# Patient Record
Sex: Male | Born: 1965 | Race: White | Hispanic: No | Marital: Married | Smoking: Current every day smoker
Health system: Southern US, Community
[De-identification: ages and names within clinical notes are randomized; demographics above are authoritative.]

## PROBLEM LIST (undated history)

## (undated) DIAGNOSIS — Z9889 Other specified postprocedural states: Secondary | ICD-10-CM

## (undated) DIAGNOSIS — R112 Nausea with vomiting, unspecified: Secondary | ICD-10-CM

## (undated) DIAGNOSIS — T428X1A Poisoning by antiparkinsonism drugs and other central muscle-tone depressants, accidental (unintentional), initial encounter: Secondary | ICD-10-CM

## (undated) DIAGNOSIS — F419 Anxiety disorder, unspecified: Secondary | ICD-10-CM

## (undated) DIAGNOSIS — M1990 Unspecified osteoarthritis, unspecified site: Secondary | ICD-10-CM

## (undated) DIAGNOSIS — Z87442 Personal history of urinary calculi: Secondary | ICD-10-CM

## (undated) HISTORY — PX: FEMUR SURGERY: SHX943

## (undated) HISTORY — PX: VASECTOMY: SHX75

---

## 1995-04-29 HISTORY — PX: FOOT SURGERY: SHX648

## 1998-04-28 HISTORY — PX: HERNIA REPAIR: SHX51

## 1999-03-08 ENCOUNTER — Encounter: Payer: Self-pay | Admitting: Family Medicine

## 1999-03-08 ENCOUNTER — Inpatient Hospital Stay (HOSPITAL_COMMUNITY): Admission: EM | Admit: 1999-03-08 | Discharge: 1999-03-10 | Payer: Self-pay | Admitting: Family Medicine

## 1999-03-09 ENCOUNTER — Encounter: Payer: Self-pay | Admitting: Internal Medicine

## 1999-12-13 ENCOUNTER — Inpatient Hospital Stay (HOSPITAL_COMMUNITY): Admission: EM | Admit: 1999-12-13 | Discharge: 1999-12-15 | Payer: Self-pay | Admitting: *Deleted

## 1999-12-13 ENCOUNTER — Encounter (INDEPENDENT_AMBULATORY_CARE_PROVIDER_SITE_OTHER): Payer: Self-pay | Admitting: Specialist

## 2000-01-06 ENCOUNTER — Encounter: Admission: RE | Admit: 2000-01-06 | Discharge: 2000-01-06 | Payer: Self-pay | Admitting: General Surgery

## 2000-01-06 ENCOUNTER — Encounter: Payer: Self-pay | Admitting: General Surgery

## 2000-04-28 HISTORY — PX: APPENDECTOMY: SHX54

## 2000-08-04 ENCOUNTER — Ambulatory Visit (HOSPITAL_BASED_OUTPATIENT_CLINIC_OR_DEPARTMENT_OTHER): Admission: RE | Admit: 2000-08-04 | Discharge: 2000-08-04 | Payer: Self-pay | Admitting: General Surgery

## 2003-01-24 ENCOUNTER — Encounter: Admission: RE | Admit: 2003-01-24 | Discharge: 2003-01-24 | Payer: Self-pay | Admitting: Diagnostic Radiology

## 2003-05-22 ENCOUNTER — Encounter: Admission: RE | Admit: 2003-05-22 | Discharge: 2003-05-22 | Payer: Self-pay | Admitting: Family Medicine

## 2003-06-22 ENCOUNTER — Encounter: Admission: RE | Admit: 2003-06-22 | Discharge: 2003-06-22 | Payer: Self-pay | Admitting: Family Medicine

## 2003-07-03 ENCOUNTER — Encounter: Admission: RE | Admit: 2003-07-03 | Discharge: 2003-07-25 | Payer: Self-pay | Admitting: Family Medicine

## 2003-07-22 ENCOUNTER — Inpatient Hospital Stay (HOSPITAL_COMMUNITY): Admission: EM | Admit: 2003-07-22 | Discharge: 2003-07-23 | Payer: Self-pay | Admitting: Emergency Medicine

## 2003-11-16 ENCOUNTER — Emergency Department (HOSPITAL_COMMUNITY): Admission: EM | Admit: 2003-11-16 | Discharge: 2003-11-16 | Payer: Self-pay | Admitting: Emergency Medicine

## 2004-05-17 ENCOUNTER — Encounter: Admission: RE | Admit: 2004-05-17 | Discharge: 2004-05-17 | Payer: Self-pay | Admitting: Family Medicine

## 2006-10-13 ENCOUNTER — Encounter: Admission: RE | Admit: 2006-10-13 | Discharge: 2006-10-13 | Payer: Self-pay | Admitting: Family Medicine

## 2008-03-06 ENCOUNTER — Encounter: Admission: RE | Admit: 2008-03-06 | Discharge: 2008-03-06 | Payer: Self-pay | Admitting: Family Medicine

## 2010-05-19 ENCOUNTER — Encounter: Payer: Self-pay | Admitting: Family Medicine

## 2010-09-13 NOTE — Op Note (Signed)
[facility]  Patient:    Justin Raymond, Justin Raymond                        MRN: [id number] Proc. Date: 12/13/99 Adm. Date:  [id number] Disc. Date: [id number] Attending:  Caleen Essex                           Operative Report  PREOPERATIVE DIAGNOSIS:  Appendicitis.  POSTOPERATIVE DIAGNOSIS:  Appendicitis.  OPERATION PERFORMED:  Appendectomy.  SURGEON:  Chevis Pretty, M.D.  ANESTHESIA:  General endotracheal.  DESCRIPTION OF PROCEDURE:  After informed consent was obtained, the patient was brought to the operating room and placed in the supine position on the operating table.  After adequate induction of general anesthesia, the patients abdomen was prepped with Betadine and draped in the usual sterile manner.  A transverse incision was made in his right lower quadrant approximately halfway between his anterosuperior iliac spine and umbilicus. This incision was carried down through the skin and subcutaneous tissue using the Bovie electrocautery until the fascia of the external oblique was encountered.  This fascial layer was opened with the Bovie electrocautery and the muscles were spread bluntly.  Next, the fascia of the internal oblique and transversalis layers were encountered.  These were both opened with the Bovie electrocautery until the peritoneum was encountered.  The peritoneum was grasped between two tonsil clamps and palpated to make sure there were no viscera between these clamps. The peritoneum was then opened with the Metzenbaum scissors and then under direct vision, the rest of the peritoneum was opened with the Bovie electrocautery.  The abdomen was palpated with an index finger and the appendix was encountered.  It was mobilized up into the wound and grasped with a Babcock clamp.  The mesentery of the appendix was taken down by serially clamping, cutting and tying with 2-0 silk ties until the base of the appendix where it joins with the cecum was  cleared.  A 2-0 silk pursestring suture was placed around the base of the appendix.  The appendix was then clamped with a straight clamp at its base.  The clamp was then removed and placed just several mm distal to this.  The previously clamped portion of the base of the appendix was then tied with a 0 chromic.  The appendix was then excised with a 10 blade knife and sent for pathology.  The appendiceal stump was grasped with a hemostat and dunked into the cecum.  The pursestring suture was then cinched down and tied.  A Z stitch was then placed over top of this with another 2-0 silk suture.  The abdomen was irrigated with copious amounts of saline.  The peritoneum, transversalis and internal oblique layer was closed with a 0 PDS in a running fashion.  The wound was then irrigated with saline again and the external oblique layer was also closed with a 0 PDS suture in a running fashion.  The wound again was irrigated and Scarpas fascia was reapproximated with several 2-0 Vicryl interrupted sutures.  The skin was closed with a running 4-0 Monocryl subcuticular stitch.  A sterile dressing and Steri-Strips were applied.  The patient tolerated the procedure well.  At the end of the case, all needle, sponge and instrument counts were correct.  The patient was awakened and taken to the recovery room in stable condition. DD:  12/13/99 TD:  12/16/99 Job: [id number]

## 2010-09-13 NOTE — Discharge Summary (Signed)
[facility]  Patient:    Justin Raymond, Justin Raymond                        MRN: [id number] Adm. Date:  [id number] Disc. Date: [id number] Attending:  Caleen Essex                           Discharge Summary  For details of Mr. Makarewicz History and Physical, please see H&P. In summary, he is a 45 year old white male who presented with acute onset of right lower quadrant pain, elevated white count, and localized guarding on physical exam.  He was felt to have appendicitis and was taken to the operating room for an appendectomy.  He tolerated the procedure well but postoperatively had some significant pain problems.  His pain is now under control on p.o. pain medicines.  He is tolerating the diet.  He is ambulating without difficulty, was able to urinate without difficulty, and he is ready for discharge.  DISCHARGE MEDICATIONS: 1. His regular home medications. 2. Vicodin 1 to 2 p.o. q.4-6h.  ACTIVITY: He is instructed to do no heavy lifting for the next several weeks.  DIET:  Regular.  CONDITION:  Stable.  FOLLOWUP:  We will have him follow up in approximately two weeks in the clinic. DD:  12/15/99 TD:  12/16/99 Job: [id number]

## 2010-09-13 NOTE — Op Note (Signed)
[facility]  Patient:    Justin Raymond, Justin Raymond                        MRN: [id number] Proc. Date: 08/04/00 Adm. Date:  [id number] Attending:  Janalyn Rouse CC:         Dyanne Carrel, M.D.                           Operative Report  PREOPERATIVE DIAGNOSIS:  Right inguinal hernia.  POSTOPERATIVE DIAGNOSIS:  Right direct inguinal hernia.  OPERATION PERFORMED:  Right inguinal hernia repair with Marlex mesh.  SURGEON:  Rose Phi. Maple Hudson, M.D.  ANESTHESIA:  General.  DESCRIPTION OF PROCEDURE:  After suitable general anesthesia was induced, the patient was placed in the supine position and the right lower abdomen prepped and draped in the usual fashion.  A local infiltration using 0.25% Marcaine was used as we progressed along the procedure.  A right inguinal incision was made with dissection down to the fascia of the external oblique.  Hemostasis obtained with the cautery.  We then infiltrated under the external oblique fascia and incised it down to the external ring.  The ilioinguinal nerve was protected.  Spermatic cord was mobilized and a drain placed about it and careful dissection revealed no indirect component.  He had two small defects in the floor of the canal where preperitoneal fat was protruding through and which had caused his swelling and his pain. Both of these were reduced and the defects closed with 2-0 Vicryl.  We then took appropriately trimmed Marlex mesh and placed it on the floor of the canal in a tension free fashion with a lateral margin incised for the cord.  Interrupted 2-0 Prolene sutures were then used to suture the lower margin to the shelving portion of Pouparts ligament and the upper margin to the muscle.  The tail of the mesh was sutured lateral to the cord.  The external oblique fascia was then closed with a running 2-0 Vicryl.  3-0 Vicryl was used for the subcutaneous tissues.  Interrupted subcuticular  4-0 Monocryls were used for the skin along with Steri-Strips.  Dressing applied. The patient was transferred to the recovery room in satisfactory condition having tolerated the procedure well. DD:  08/04/00 TD:  08/04/00 Job: [id number]

## 2013-11-02 ENCOUNTER — Encounter (HOSPITAL_COMMUNITY): Payer: Self-pay | Admitting: Emergency Medicine

## 2013-11-02 ENCOUNTER — Emergency Department (HOSPITAL_COMMUNITY)
Admission: EM | Admit: 2013-11-02 | Discharge: 2013-11-02 | Disposition: A | Discharge status: Home / Self Care | Payer: No Typology Code available for payment source | Attending: Emergency Medicine | Admitting: Emergency Medicine

## 2013-11-02 ENCOUNTER — Emergency Department (HOSPITAL_COMMUNITY): Payer: No Typology Code available for payment source

## 2013-11-02 DIAGNOSIS — IMO0002 Reserved for concepts with insufficient information to code with codable children: Secondary | ICD-10-CM | POA: Insufficient documentation

## 2013-11-02 DIAGNOSIS — F17200 Nicotine dependence, unspecified, uncomplicated: Secondary | ICD-10-CM | POA: Insufficient documentation

## 2013-11-02 DIAGNOSIS — M5413 Radiculopathy, cervicothoracic region: Secondary | ICD-10-CM

## 2013-11-02 DIAGNOSIS — M5412 Radiculopathy, cervical region: Secondary | ICD-10-CM | POA: Insufficient documentation

## 2013-11-02 MED ORDER — KETOROLAC TROMETHAMINE 60 MG/2ML IM SOLN
60.0000 mg | Freq: Once | INTRAMUSCULAR | Status: AC
  Administered 2013-11-02: 60 mg via INTRAMUSCULAR
  Filled 2013-11-02: qty 2

## 2013-11-02 MED ORDER — DEXAMETHASONE SODIUM PHOSPHATE 10 MG/ML IJ SOLN
10.0000 mg | Freq: Once | INTRAMUSCULAR | Status: AC
  Administered 2013-11-02: 10 mg via INTRAMUSCULAR
  Filled 2013-11-02: qty 1

## 2013-11-02 MED ORDER — PREDNISONE 50 MG PO TABS: 50.0000 mg | ORAL_TABLET | Freq: Every day | ORAL | Status: DC

## 2013-11-02 MED ORDER — HYDROCODONE-ACETAMINOPHEN 5-325 MG PO TABS: 1.0000 | ORAL_TABLET | Freq: Four times a day (QID) | ORAL | Status: DC | PRN

## 2013-11-02 NOTE — ED Notes (Signed)
Pt c/o increasing L arm numbness and tingling starting this morning and L shoulder pain radiating into L chest and down L arm starting this afternoon.  Pain score 2/10.  Pt reports "it feels like my fingers are asleep."  Pt reports that numbness and pain get worse in certain positions.

## 2013-11-02 NOTE — Discharge Instructions (Signed)
Followup with your Dr. and the neurosurgeon provided.  Return here as needed.  Use ice and heat on your neck and upper back

## 2013-11-02 NOTE — ED Provider Notes (Signed)
Medical screening examination/treatment/procedure(s) were performed by non-physician practitioner and as supervising physician I was immediately available for consultation/collaboration.   EKG Interpretation None       Juliet RudeNathan R. Rubin PayorPickering, MD 11/02/13 [phone]

## 2013-11-02 NOTE — ED Provider Notes (Signed)
CSN: [id number]     Arrival date & time 11/02/13  1853 History   First MD Initiated Contact with Patient 11/02/13 1858     Chief Complaint  Patient presents with  . Numbness  . Back Pain     (Consider location/radiation/quality/duration/timing/severity/associated sxs/prior Treatment) Patient is a 48 y.o. male presenting with back pain.  Back Pain   Mr. Justin Raymond is a 48 year old presents to the ED complaining of left arm numbness and tingling. He states that pain and tingling started this morning with a sudden onset and has gradually gotten worse. He notes the pain starts in his shoulder blade area and radiates down his left arm and towards his left pectoral muscle. He notes movement makes it feel better and not moving makes it worse. 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 He states the numbness and tingling is still present. He rates it a 2 out of 10.  Endorses: numbness/tingling in left arm. Denies: headache, fatigue, change in appetite, sore throat, shortness of breath, cough, chest pain, abdominal pain, nausea, vomiting, diarrhea.   History reviewed. No pertinent past medical history. Past Surgical History  Procedure Laterality Date  . Femur surgery    . Hernia repair     History reviewed. No pertinent family history. History  Substance Use Topics  . Smoking status: Current Every Day Smoker -- 2.00 packs/day    Types: Cigarettes  . Smokeless tobacco: Not on file  . Alcohol Use: Yes     Comment: occ    Review of Systems  Musculoskeletal: Positive for back pain.   All other systems negative except as documented in the HPI. All pertinent positives and negatives as reviewed in the HPI.    Allergies  Review of patient's allergies indicates not on file.  Home Medications   Prior to Admission medications   Not on File   BP 152/91  Temp(Src) 98.7 F (37.1 C) (Oral)  Resp 18   SpO2 98% Physical Exam  Nursing note and vitals reviewed. Constitutional: He is oriented to person, place, and time. He appears well-developed and well-nourished.  HENT:  Head: Normocephalic.  Eyes: Pupils are equal, round, and reactive to light.  Cardiovascular: Normal rate, regular rhythm and normal heart sounds.   No murmur heard. Pulmonary/Chest: Effort normal and breath sounds normal. No respiratory distress. He has no rales.  Abdominal: Soft.  Musculoskeletal: Normal range of motion.  Neurological: He is alert and oriented to person, place, and time. He has normal strength. He displays normal reflexes. No cranial nerve deficit or sensory deficit. He exhibits normal muscle tone. Coordination and gait normal. GCS eye subscore is 4. GCS verbal subscore is 5. GCS motor subscore is 6.  Reflex Scores:      Tricep reflexes are 2+ on the right side and 2+ on the left side.      Bicep reflexes are 2+ on the right side and 2+ on the left side.      Brachioradialis reflexes are 2+ on the right side and 2+ on the left side. Skin: Skin is warm and dry.    ED Course  Procedures (including critical care time)  Patient be treated for cervical radiculopathy.  Told to follow up with his primary care Dr. and followup with neurosurgery patient is advised to use ice and heat on his neck and back.  Patient has no neurological deficits noted on exam.  Carlyle Dollyhristopher W Lyrika Souders, PA-C 11/02/13 2030

## 2013-11-02 NOTE — Progress Notes (Signed)
  CARE MANAGEMENT ED NOTE 11/02/2013  Patient:  Justin Raymond,Justin Raymond   Account Number:  [id number]  Date Initiated:  11/02/2013  Documentation initiated by:  Radford PaxFERRERO,Raylyn Speckman  Subjective/Objective Assessment:   Patient presents to Ed with left arm numbness and tingling     Subjective/Objective Assessment Detail:     Action/Plan:   Action/Plan Detail:   Anticipated DC Date:       Status Recommendation to Physician:   Result of Recommendation:    Other ED Services  Consult Working Plan    DC Planning Services  Other  PCP issues    Choice offered to / List presented to:            Status of service:  Completed, signed off  ED Comments:   ED Comments Detail:  EDCM spoke to patient at bedside.  Patient confirms his pcp is Dr[facility] Man[facility].  System updated.

## 2013-11-05 ENCOUNTER — Encounter (HOSPITAL_COMMUNITY): Payer: Self-pay | Admitting: Emergency Medicine

## 2013-11-05 ENCOUNTER — Emergency Department (HOSPITAL_COMMUNITY)
Admission: EM | Admit: 2013-11-05 | Discharge: 2013-11-05 | Disposition: A | Discharge status: Home / Self Care | Payer: No Typology Code available for payment source | Attending: Emergency Medicine | Admitting: Emergency Medicine

## 2013-11-05 ENCOUNTER — Emergency Department (HOSPITAL_COMMUNITY): Payer: No Typology Code available for payment source

## 2013-11-05 DIAGNOSIS — R000 Tachycardia, unspecified: Secondary | ICD-10-CM | POA: Insufficient documentation

## 2013-11-05 DIAGNOSIS — Z79899 Other long term (current) drug therapy: Secondary | ICD-10-CM | POA: Insufficient documentation

## 2013-11-05 DIAGNOSIS — IMO0002 Reserved for concepts with insufficient information to code with codable children: Secondary | ICD-10-CM | POA: Insufficient documentation

## 2013-11-05 DIAGNOSIS — M542 Cervicalgia: Secondary | ICD-10-CM

## 2013-11-05 DIAGNOSIS — F17200 Nicotine dependence, unspecified, uncomplicated: Secondary | ICD-10-CM | POA: Insufficient documentation

## 2013-11-05 DIAGNOSIS — M5412 Radiculopathy, cervical region: Secondary | ICD-10-CM | POA: Insufficient documentation

## 2013-11-05 LAB — I-STAT TROPONIN, ED: TROPONIN I, POC: 0.01 ng/mL (ref 0.00–0.08)

## 2013-11-05 MED ORDER — DIAZEPAM 5 MG/ML IJ SOLN
5.0000 mg | Freq: Once | INTRAMUSCULAR | Status: AC
  Administered 2013-11-05: 5 mg via INTRAMUSCULAR
  Filled 2013-11-05: qty 2

## 2013-11-05 MED ORDER — HYDROMORPHONE HCL PF 1 MG/ML IJ SOLN
1.0000 mg | Freq: Once | INTRAMUSCULAR | Status: AC
  Administered 2013-11-05: 1 mg via INTRAMUSCULAR
  Filled 2013-11-05: qty 1

## 2013-11-05 MED ORDER — OXYCODONE-ACETAMINOPHEN 5-325 MG PO TABS: 1.0000 | ORAL_TABLET | Freq: Four times a day (QID) | ORAL | Status: DC | PRN

## 2013-11-05 MED ORDER — DIAZEPAM 5 MG PO TABS: 5.0000 mg | ORAL_TABLET | Freq: Four times a day (QID) | ORAL | Status: DC | PRN

## 2013-11-05 NOTE — ED Provider Notes (Signed)
CSN: [id number]     Arrival date & time 11/05/13  1503 History   First MD Initiated Contact with Patient 11/05/13 1618     Chief Complaint  Patient presents with  . Neck Pain   HPI Comments: Patient is a 48 y.o. Male who presents to the ED for neck pain which radiates down the left arm.  Patient states that he started to have neck pain when he woke up on Wednesday.  It has gradually worsened.  Patient describes pain as a 10/10 pain, which radiates down the left arm like lightening.  He feels like his muscles are going to break.  Patient was seen here in the ED on 11/02/13 and was given norco and prednisone which he has been taking at home with minor control of his symptoms.  Patient states that his last norco was a 9:00 am today.  He states that he has also had some chest pain as well today which is associated with some shortness of breath.  Patient is a smoker.     Patient is a 48 y.o. male presenting with neck pain. The history is provided by the patient. No language interpreter was used.  Neck Pain Associated symptoms: no fever     History reviewed. No pertinent past medical history. Past Surgical History  Procedure Laterality Date  . Femur surgery    . Hernia repair     No family history on file. History  Substance Use Topics  . Smoking status: Current Every Day Smoker -- 2.00 packs/day    Types: Cigarettes  . Smokeless tobacco: Not on file  . Alcohol Use: Yes     Comment: occ    Review of Systems  Constitutional: Negative for fever, chills and fatigue.  Gastrointestinal: Negative for nausea, vomiting, abdominal pain, diarrhea, constipation and blood in stool.  Genitourinary: Negative for dysuria, urgency, frequency, hematuria, decreased urine volume and difficulty urinating.  Musculoskeletal: Positive for myalgias, neck pain and neck stiffness. Negative for arthralgias, back pain, gait problem and joint swelling.  All other systems reviewed and are negative.     Allergies   Review of patient's allergies indicates no known allergies.  Home Medications   Prior to Admission medications   Medication Sig Start Date End Date Taking? Authorizing Provider  HYDROcodone-acetaminophen (NORCO/VICODIN) 5-325 MG per tablet Take 1 tablet by mouth every 6 (six) hours as needed for moderate pain. 11/02/13  Yes Jamesetta Orleans Lawyer, PA-C  predniSONE (DELTASONE) 50 MG tablet Take 1 tablet (50 mg total) by mouth daily. 11/02/13  Yes Christopher W Lawyer, PA-C   BP 142/98  Pulse 70  Temp(Src) 97.9 F (36.6 C) (Oral)  Resp 18  SpO2 98% Physical Exam  Nursing note and vitals reviewed. Constitutional: He is oriented to person, place, and time. He appears well-developed and well-nourished. No distress.  HENT:  Head: Normocephalic and atraumatic.  Mouth/Throat: Oropharynx is clear and moist. No oropharyngeal exudate.  Eyes: Conjunctivae and EOM are normal. Pupils are equal, round, and reactive to light. No scleral icterus.  Neck: No JVD present. No thyromegaly present.  Cardiovascular: Regular rhythm and intact distal pulses.  Tachycardia present.  Exam reveals no gallop and no friction rub.   No murmur heard. Pulmonary/Chest: Effort normal and breath sounds normal. No respiratory distress. He has no wheezes. He has no rales. He exhibits no tenderness.  Musculoskeletal:  Patient rises slowly from sitting to standing.  They walk without an antalgic gait.  There is no evidence of erythema, ecchymosis,  or gross deformity.  There is tenderness to palpation over inferior cervical spine.  There is palpable spasm of the left trapezius.  Active ROM is limited due to pain.  Sensation to light touch is intact over all extremities.  Strength is symmetric and equal in all extremities.  DTRs are equal and symmetric in all extremities.     Lymphadenopathy:    He has no cervical adenopathy.  Neurological: He is alert and oriented to person, place, and time. No cranial nerve deficit or sensory  deficit.  Skin: Skin is warm and dry. He is not diaphoretic.  Psychiatric: He has a normal mood and affect. His behavior is normal. Judgment and thought content normal.    ED Course  Procedures (including critical care time) Labs Review Labs Reviewed  Rosezena SensorI-STAT TROPOININ, ED    Imaging Review Dg Chest 2 View  11/05/2013   CLINICAL DATA:  Left-sided neck pain.  Left arm tingling.  EXAM: CHEST  2 VIEW  COMPARISON:  PA and lateral chest 11/16/2003.  FINDINGS: Lungs clear. Heart size normal. No pneumothorax or pleural effusion. Acromioclavicular degenerative change is noted.  IMPRESSION: No acute disease.   Electronically Signed   By: Drusilla Kannerhomas  Dalessio M.D.   On: 11/05/2013 18:20     EKG Interpretation None      MDM   Final diagnoses:  Neck pain  Left cervical radiculopathy   Patient is a 48 y.o. Male who presents to the ED with neck pain and chest pain.  Troponin, Ekg, and CXR here are unremarkable.  Suspect that this neck pain is likely caused by possible herniated disc which is causing cervical radiculopathy.  Patient denies IV drug use and cauda equina symptoms at this time.  Have treated the patient here with dilaudid and valium IM.  Will get the patient's pain under control at this time and send the patient home with a prescription for valium and oxycodone which should last him until tuesday.  Patient states his understanding at this time.  He was told to return for saddle anesthesias, loss of bowel or bladder, and pain which cannot be controlled at home.  I will have him follow-up with neurosurgery, and have told him to see his PCP on Monday or Tuesday.  He was given cauda equina symptoms as his return precautions.     Eben Burowourtney A Forcucci, PA-C 11/05/13 2006

## 2013-11-05 NOTE — ED Notes (Signed)
He tells us he was recently seen here for non-traumatic neck pain and was told he has a "pinched nerve".  He arrives very agitated, holding his left shoulder in an exaggerated flexion position and paces, as if in much pain.  He states the pain radiated into his chest.  His skin is normal, warm and dry.

## 2013-11-05 NOTE — ED Notes (Signed)
Bed: WA21 Expected date:  Expected time:  Means of arrival:  Comments: HOLD

## 2013-11-05 NOTE — Discharge Instructions (Signed)
Discontinue Norco and begin taking oxycodone for pain as needed every 6 hours.  You may also take valium as you need it for muscle spasm every 6 hours.  Make sure you take your medicine at the first sign of pain to avoid severe pain.  Follow-up with your PCP on Monday or Tuesday.  Call first thing Monday morning to schedule an appointment with the neurosurgeon.  Avoid heavy lifting and twisting activities.  Do no drive on your medications.  Take your prednisone until it is gone.    Cervical Radiculopathy Cervical radiculopathy happens when a nerve in the neck is pinched or bruised by a slipped (herniated) disk or by arthritic changes in the bones of the cervical spine. This can occur due to an injury or as part of the normal aging process. Pressure on the cervical nerves can cause pain or numbness that runs from your neck all the way down into your arm and fingers. CAUSES  There are many possible causes, including:  Injury.  Muscle tightness in the neck from overuse.  Swollen, painful joints (arthritis).  Breakdown or degeneration in the bones and joints of the spine (spondylosis) due to aging.  Bone spurs that may develop near the cervical nerves. SYMPTOMS  Symptoms include pain, weakness, or numbness in the affected arm and hand. Pain can be severe or irritating. Symptoms may be worse when extending or turning the neck. DIAGNOSIS  Your caregiver will ask about your symptoms and do a physical exam. He or she may test your strength and reflexes. X-rays, CT scans, and MRI scans may be needed in cases of injury or if the symptoms do not go away after a period of time. Electromyography (EMG) or nerve conduction testing may be done to study how your nerves and muscles are working. TREATMENT  Your caregiver may recommend certain exercises to help relieve your symptoms. Cervical radiculopathy can, and often does, get better with time and treatment. If your problems continue, treatment options may  include:  Wearing a soft collar for short periods of time.  Physical therapy to strengthen the neck muscles.  Medicines, such as nonsteroidal anti-inflammatory drugs (NSAIDs), oral corticosteroids, or spinal injections.  Surgery. Different types of surgery may be done depending on the cause of your problems. HOME CARE INSTRUCTIONS   Put ice on the affected area.  Put ice in a plastic bag.  Place a towel between your skin and the bag.  Leave the ice on for 15-20 minutes, 03-04 times a day or as directed by your caregiver.  If ice does not help, you can try using heat. Take a warm shower or bath, or use a hot water bottle as directed by your caregiver.  You may try a gentle neck and shoulder massage.  Use a flat pillow when you sleep.  Only take over-the-counter or prescription medicines for pain, discomfort, or fever as directed by your caregiver.  If physical therapy was prescribed, follow your caregiver's directions.  If a soft collar was prescribed, use it as directed. SEEK IMMEDIATE MEDICAL CARE IF:   Your pain gets much worse and cannot be controlled with medicines.  You have weakness or numbness in your hand, arm, face, or leg.  You have a high fever or a stiff, rigid neck.  You lose bowel or bladder control (incontinence).  You have trouble with walking, balance, or speaking. MAKE SURE YOU:   Understand these instructions.  Will watch your condition.  Will get help right away if you  are not doing well or get worse. Document Released: 01/07/2001 Document Revised: 07/07/2011 Document Reviewed: 11/26/2010 [facility] Patient Information 2015 [place], [place]. This information is not intended to replace advice given to you by your health care provider. Make sure you discuss any questions you have with your health care provider.

## 2013-11-06 NOTE — ED Provider Notes (Signed)
Medical screening examination/treatment/procedure(s) were conducted as a shared visit with non-physician practitioner(s) and myself.  I personally evaluated the patient during the encounter.   EKG Interpretation   Date/Time:  Saturday November 05 2013 15:08:19 EDT Ventricular Rate:  73 PR Interval:  203 QRS Duration: 111 QT Interval:  407 QTC Calculation: 448 R Axis:   85 Text Interpretation:  Sinus rhythm Borderline prolonged PR interval  Probable anteroseptal infarct, old Confirmed by Rubin PayorPICKERING  MD, Rathana Viveros  [phone]) on 11/06/2013 12:18:46 AM     Patient with chest pain. EKG reassuring. More complaint is his left shoulder/arm pain. Likely radicular in origin. Pain is improved somewhat after treatment. Recently had x-ray of the cervical spine. Will discharge home to follow with neurosurgery  Juliet RudeNathan R. Rubin PayorPickering, MD 11/06/13 [id number]

## 2013-12-27 ENCOUNTER — Other Ambulatory Visit: Payer: Self-pay | Admitting: Neurosurgery

## 2014-01-04 ENCOUNTER — Encounter (HOSPITAL_COMMUNITY): Payer: Self-pay | Admitting: Pharmacy Technician

## 2014-01-06 ENCOUNTER — Encounter (HOSPITAL_COMMUNITY): Payer: Self-pay

## 2014-01-06 ENCOUNTER — Encounter (HOSPITAL_COMMUNITY)
Admission: RE | Admit: 2014-01-06 | Discharge: 2014-01-06 | Disposition: A | Discharge status: Home / Self Care | Payer: No Typology Code available for payment source | Source: Ambulatory Visit | Attending: Neurosurgery | Admitting: Neurosurgery

## 2014-01-06 DIAGNOSIS — M5020 Other cervical disc displacement, unspecified cervical region: Secondary | ICD-10-CM | POA: Diagnosis present

## 2014-01-06 DIAGNOSIS — Z01812 Encounter for preprocedural laboratory examination: Secondary | ICD-10-CM | POA: Insufficient documentation

## 2014-01-06 DIAGNOSIS — M4802 Spinal stenosis, cervical region: Secondary | ICD-10-CM | POA: Diagnosis present

## 2014-01-06 HISTORY — DX: Poisoning by antiparkinsonism drugs and other central muscle-tone depressants, accidental (unintentional), initial encounter: T42.8X1A

## 2014-01-06 HISTORY — DX: Anxiety disorder, unspecified: F41.9

## 2014-01-06 HISTORY — DX: Unspecified osteoarthritis, unspecified site: M19.90

## 2014-01-06 HISTORY — DX: Other specified postprocedural states: Z98.890

## 2014-01-06 HISTORY — DX: Personal history of urinary calculi: Z87.442

## 2014-01-06 HISTORY — DX: Nausea with vomiting, unspecified: R11.2

## 2014-01-06 LAB — CBC
HEMATOCRIT: 45.9 % (ref 39.0–52.0)
HEMOGLOBIN: 16.5 g/dL (ref 13.0–17.0)
MCH: 32 pg (ref 26.0–34.0)
MCHC: 35.9 g/dL (ref 30.0–36.0)
MCV: 89.1 fL (ref 78.0–100.0)
Platelets: 264 10*3/uL (ref 150–400)
RBC: 5.15 MIL/uL (ref 4.22–5.81)
RDW: 13 % (ref 11.5–15.5)
WBC: 8.8 10*3/uL (ref 4.0–10.5)

## 2014-01-06 LAB — SURGICAL PCR SCREEN
MRSA, PCR: NEGATIVE
STAPHYLOCOCCUS AUREUS: POSITIVE — AB

## 2014-01-06 LAB — BASIC METABOLIC PANEL
Anion gap: 17 — ABNORMAL HIGH (ref 5–15)
BUN: 7 mg/dL (ref 6–23)
CHLORIDE: 103 meq/L (ref 96–112)
CO2: 21 meq/L (ref 19–32)
CREATININE: 0.65 mg/dL (ref 0.50–1.35)
Calcium: 9.3 mg/dL (ref 8.4–10.5)
GFR calc Af Amer: >90 mL/min (ref >90)
GFR calc non Af Amer: >90 mL/min (ref >90)
GLUCOSE: 128 mg/dL — AB (ref 70–99)
Potassium: 3.5 mEq/L — ABNORMAL LOW (ref 3.7–5.3)
Sodium: 141 mEq/L (ref 137–147)

## 2014-01-06 NOTE — Progress Notes (Signed)
Office called to release orders.. Spoke with vanessa 
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
## 2014-01-06 NOTE — Pre-Procedure Instructions (Addendum)
Justin Raymond  01/06/2014   Your procedure is scheduled on:  Tuesday, September 15.  Report to [facility] Admitting at 10:25 AM.  Call this number if you have problems the morning of surgery: [phone]   Remember:   Do not eat food or drink liquids after midnight Monday, September 14.   Take these medicines the morning of surgery with A SIP OF WATER: Take pain medication if needed,valium if needed    STOP all herbel meds, nsaids (aleve,naproxen,advil,ibuprofen) 5 days prior to surgery including vitamins, aspirin.   Do not wear jewelry, make-up or nail polish.  Do not wear lotions, powders, or perfumes.    Men may shave face and neck.  Do not bring valuables to the hospital.              [facility] is not responsible for any belongings or valuables.               Contacts, dentures or bridgework may not be worn into surgery.  Leave suitcase in the car. After surgery it may be brought to your room.  For patients admitted to the hospital, discharge time is determined by your treatment team.               Patients discharged the day of surgery will not be allowed to drive home.  Name and phone number of your driver: -   Special Instructions: - Special Instructions: [name] - Preparing for Surgery  Before surgery, you can play an important role.  Because skin is not sterile, your skin needs to be as free of germs as possible.  You can reduce the number of germs on you skin by washing with CHG (chlorahexidine gluconate) soap before surgery.  CHG is an antiseptic cleaner which kills germs and bonds with the skin to continue killing germs even after washing.  Please DO NOT use if you have an allergy to CHG or antibacterial soaps.  If your skin becomes reddened/irritated stop using the CHG and inform your nurse when you arrive at Short Stay.  Do not shave (including legs and underarms) for at least 48 hours prior to the first CHG shower.  You may shave your face.  Please  follow these instructions carefully:   1.  Shower with CHG Soap the night before surgery and the morning of Surgery.  2.  If you choose to wash your hair, wash your hair first as usual with your normal shampoo.  3.  After you shampoo, rinse your hair and body thoroughly to remove the Shampoo.  4.  Use CHG as you would any other liquid soap.  You can apply chg directly  to the skin and wash gently with scrungie or a clean washcloth.  5.  Apply the CHG Soap to your body ONLY FROM THE NECK DOWN.  Do not use on open wounds or open sores.  Avoid contact with your eyes ears, mouth and genitals (private parts).  Wash genitals (private parts)       with your normal soap.  6.  Wash thoroughly, paying special attention to the area where your surgery will be performed.  7.  Thoroughly rinse your body with warm water from the neck down.  8.  DO NOT shower/wash with your normal soap after using and rinsing off the CHG Soap.  9.  Pat yourself dry with a clean towel.            10.  Wear clean  pajamas.            11.  Place clean sheets on your bed the night of your first shower and do not sleep with pets.  Day of Surgery  Do not apply any lotions/deodorants the morning of surgery.  Please wear clean clothes to the hospital/surgery center.   Please read over the following fact sheets that you were given: Pain Booklet, Coughing and Deep Breathing and Surgical Site Infection Prevention

## 2014-01-06 NOTE — Pre-Procedure Instructions (Signed)
[name]  01/06/2014   Your procedure is scheduled on:  Tuesday, September 15.  Report to [facility] Admitting at 10:25 AM.  Call this number if you have problems the morning of surgery: [phone]   Remember:   Do not eat food or drink liquids after midnight Monday, September 14.   Take these medicines the morning of surgery with A SIP OF WATER: Take pain medication if needed.   Do not wear jewelry, make-up or nail polish.  Do not wear lotions, powders, or perfumes.    Men may shave face and neck.  Do not bring valuables to the hospital.              [facility] is not responsible for any belongings or valuables.               Contacts, dentures or bridgework may not be worn into surgery.  Leave suitcase in the car. After surgery it may be brought to your room.  For patients admitted to the hospital, discharge time is determined by your treatment team.               Patients discharged the day of surgery will not be allowed to drive home.  Name and phone number of your driver: -   Special Instructions: -   Please read over the following fact sheets that you were given: Pain Booklet, Coughing and Deep Breathing and Surgical Site Infection Prevention
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
## 2014-01-10 ENCOUNTER — Encounter (HOSPITAL_COMMUNITY)
Admission: RE | Disposition: A | Discharge status: Home / Self Care | Payer: Self-pay | Source: Ambulatory Visit | Attending: Neurosurgery

## 2014-01-10 ENCOUNTER — Encounter (HOSPITAL_COMMUNITY): Payer: No Typology Code available for payment source | Admitting: Vascular Surgery

## 2014-01-10 ENCOUNTER — Ambulatory Visit (HOSPITAL_COMMUNITY)
Admission: RE | Admit: 2014-01-10 | Discharge: 2014-01-11 | Disposition: A | Discharge status: Home / Self Care | Payer: No Typology Code available for payment source | Source: Ambulatory Visit | Attending: Neurosurgery | Admitting: Neurosurgery

## 2014-01-10 ENCOUNTER — Ambulatory Visit (HOSPITAL_COMMUNITY): Payer: No Typology Code available for payment source

## 2014-01-10 ENCOUNTER — Ambulatory Visit (HOSPITAL_COMMUNITY): Payer: No Typology Code available for payment source | Admitting: Certified Registered Nurse Anesthetist

## 2014-01-10 ENCOUNTER — Encounter (HOSPITAL_COMMUNITY): Payer: Self-pay | Admitting: Certified Registered Nurse Anesthetist

## 2014-01-10 DIAGNOSIS — F411 Generalized anxiety disorder: Secondary | ICD-10-CM | POA: Diagnosis not present

## 2014-01-10 DIAGNOSIS — M5020 Other cervical disc displacement, unspecified cervical region: Secondary | ICD-10-CM | POA: Diagnosis present

## 2014-01-10 DIAGNOSIS — M129 Arthropathy, unspecified: Secondary | ICD-10-CM | POA: Insufficient documentation

## 2014-01-10 HISTORY — PX: ANTERIOR CERVICAL DECOMP/DISCECTOMY FUSION: SHX1161

## 2014-01-10 SURGERY — ANTERIOR CERVICAL DECOMPRESSION/DISCECTOMY FUSION 1 LEVEL
Anesthesia: General

## 2014-01-10 MED ORDER — FENTANYL CITRATE 0.05 MG/ML IJ SOLN
INTRAMUSCULAR | Status: DC | PRN
  Administered 2014-01-10: 150 ug via INTRAVENOUS
  Administered 2014-01-10 (×5): 50 ug via INTRAVENOUS

## 2014-01-10 MED ORDER — NEOSTIGMINE METHYLSULFATE 10 MG/10ML IV SOLN
INTRAVENOUS | Status: DC | PRN
  Administered 2014-01-10: 4 mg via INTRAVENOUS

## 2014-01-10 MED ORDER — DEXAMETHASONE SODIUM PHOSPHATE 10 MG/ML IJ SOLN
INTRAMUSCULAR | Status: AC
  Filled 2014-01-10: qty 1

## 2014-01-10 MED ORDER — FENTANYL CITRATE 0.05 MG/ML IJ SOLN
INTRAMUSCULAR | Status: AC
  Filled 2014-01-10: qty 5

## 2014-01-10 MED ORDER — HYDROMORPHONE HCL PF 1 MG/ML IJ SOLN
INTRAMUSCULAR | Status: AC
  Filled 2014-01-10: qty 1

## 2014-01-10 MED ORDER — CEFAZOLIN SODIUM-DEXTROSE 2-3 GM-% IV SOLR
2.0000 g | INTRAVENOUS | Status: AC
  Administered 2014-01-10: 2 g via INTRAVENOUS

## 2014-01-10 MED ORDER — ONDANSETRON HCL 4 MG/2ML IJ SOLN
INTRAMUSCULAR | Status: DC | PRN
  Administered 2014-01-10: 4 mg via INTRAVENOUS

## 2014-01-10 MED ORDER — KETOROLAC TROMETHAMINE 0.5 % OP SOLN
1.0000 [drp] | Freq: Four times a day (QID) | OPHTHALMIC | Status: DC
  Administered 2014-01-10 (×2): 1 [drp] via OPHTHALMIC
  Filled 2014-01-10 (×2): qty 3

## 2014-01-10 MED ORDER — DEXAMETHASONE SODIUM PHOSPHATE 10 MG/ML IJ SOLN: 10.0000 mg | INTRAMUSCULAR | Status: DC

## 2014-01-10 MED ORDER — THROMBIN 5000 UNITS EX SOLR
CUTANEOUS | Status: DC | PRN
  Administered 2014-01-10 (×2): 5000 [IU] via TOPICAL

## 2014-01-10 MED ORDER — PROPOFOL 10 MG/ML IV BOLUS
INTRAVENOUS | Status: DC | PRN
  Administered 2014-01-10: 200 mg via INTRAVENOUS

## 2014-01-10 MED ORDER — NEOSTIGMINE METHYLSULFATE 10 MG/10ML IV SOLN
INTRAVENOUS | Status: AC
  Filled 2014-01-10: qty 1

## 2014-01-10 MED ORDER — MUPIROCIN 2 % EX OINT: TOPICAL_OINTMENT | Freq: Two times a day (BID) | CUTANEOUS | Status: DC

## 2014-01-10 MED ORDER — SODIUM CHLORIDE 0.9 % IJ SOLN
3.0000 mL | INTRAMUSCULAR | Status: DC | PRN
Start: 2014-01-10 — End: 2014-01-11

## 2014-01-10 MED ORDER — PANTOPRAZOLE SODIUM 40 MG IV SOLR
40.0000 mg | Freq: Every day | INTRAVENOUS | Status: DC
  Administered 2014-01-10: 40 mg via INTRAVENOUS
  Filled 2014-01-10 (×2): qty 40

## 2014-01-10 MED ORDER — PROPOFOL 10 MG/ML IV BOLUS
INTRAVENOUS | Status: AC
  Filled 2014-01-10: qty 20

## 2014-01-10 MED ORDER — CEFAZOLIN SODIUM-DEXTROSE 2-3 GM-% IV SOLR
2.0000 g | Freq: Three times a day (TID) | INTRAVENOUS | Status: AC
  Administered 2014-01-10 – 2014-01-11 (×2): 2 g via INTRAVENOUS
  Filled 2014-01-10 (×2): qty 50

## 2014-01-10 MED ORDER — GLYCOPYRROLATE 0.2 MG/ML IJ SOLN
INTRAMUSCULAR | Status: AC
  Filled 2014-01-10: qty 2

## 2014-01-10 MED ORDER — MIDAZOLAM HCL 2 MG/2ML IJ SOLN
INTRAMUSCULAR | Status: AC
  Filled 2014-01-10: qty 2

## 2014-01-10 MED ORDER — LACTATED RINGERS IV SOLN
INTRAVENOUS | Status: DC
  Administered 2014-01-10: 12:00:00 via INTRAVENOUS

## 2014-01-10 MED ORDER — ROCURONIUM BROMIDE 100 MG/10ML IV SOLN
INTRAVENOUS | Status: DC | PRN
  Administered 2014-01-10: 50 mg via INTRAVENOUS

## 2014-01-10 MED ORDER — DEXAMETHASONE 4 MG PO TABS
4.0000 mg | ORAL_TABLET | Freq: Four times a day (QID) | ORAL | Status: AC
  Filled 2014-01-10: qty 1

## 2014-01-10 MED ORDER — LACTATED RINGERS IV SOLN
INTRAVENOUS | Status: DC | PRN
  Administered 2014-01-10 (×2): via INTRAVENOUS

## 2014-01-10 MED ORDER — KCL IN DEXTROSE-NACL 20-5-0.45 MEQ/L-%-% IV SOLN
80.0000 mL/h | INTRAVENOUS | Status: DC
  Filled 2014-01-10 (×3): qty 1000

## 2014-01-10 MED ORDER — DIAZEPAM 5 MG PO TABS
5.0000 mg | ORAL_TABLET | Freq: Four times a day (QID) | ORAL | Status: DC | PRN
  Administered 2014-01-10 – 2014-01-11 (×3): 5 mg via ORAL
  Filled 2014-01-10 (×3): qty 1

## 2014-01-10 MED ORDER — CEFAZOLIN SODIUM-DEXTROSE 2-3 GM-% IV SOLR
INTRAVENOUS | Status: AC
  Filled 2014-01-10: qty 50

## 2014-01-10 MED ORDER — OXYCODONE HCL 5 MG PO TABS
ORAL_TABLET | ORAL | Status: AC
  Filled 2014-01-10: qty 1

## 2014-01-10 MED ORDER — LIDOCAINE HCL (CARDIAC) 20 MG/ML IV SOLN
INTRAVENOUS | Status: DC | PRN
  Administered 2014-01-10: 50 mg via INTRAVENOUS

## 2014-01-10 MED ORDER — HEMOSTATIC AGENTS (NO CHARGE) OPTIME
TOPICAL | Status: DC | PRN
  Administered 2014-01-10: 1 via TOPICAL

## 2014-01-10 MED ORDER — ROCURONIUM BROMIDE 50 MG/5ML IV SOLN
INTRAVENOUS | Status: AC
  Filled 2014-01-10: qty 1

## 2014-01-10 MED ORDER — HYDROMORPHONE HCL PF 1 MG/ML IJ SOLN
0.2500 mg | INTRAMUSCULAR | Status: DC | PRN
  Administered 2014-01-10 (×4): 0.5 mg via INTRAVENOUS

## 2014-01-10 MED ORDER — ONDANSETRON HCL 4 MG/2ML IJ SOLN
INTRAMUSCULAR | Status: AC
  Filled 2014-01-10: qty 2

## 2014-01-10 MED ORDER — LIDOCAINE HCL (CARDIAC) 20 MG/ML IV SOLN
INTRAVENOUS | Status: AC
  Filled 2014-01-10: qty 5

## 2014-01-10 MED ORDER — 0.9 % SODIUM CHLORIDE (POUR BTL) OPTIME
TOPICAL | Status: DC | PRN
  Administered 2014-01-10: 1000 mL

## 2014-01-10 MED ORDER — HYDROCODONE-ACETAMINOPHEN 5-325 MG PO TABS
1.0000 | ORAL_TABLET | ORAL | Status: DC | PRN
  Administered 2014-01-10 – 2014-01-11 (×3): 2 via ORAL
  Filled 2014-01-10 (×3): qty 2

## 2014-01-10 MED ORDER — DEXAMETHASONE SODIUM PHOSPHATE 4 MG/ML IJ SOLN
4.0000 mg | Freq: Four times a day (QID) | INTRAMUSCULAR | Status: AC
  Administered 2014-01-10 (×2): 4 mg via INTRAVENOUS
  Filled 2014-01-10 (×2): qty 1

## 2014-01-10 MED ORDER — EPHEDRINE SULFATE 50 MG/ML IJ SOLN
INTRAMUSCULAR | Status: DC | PRN
  Administered 2014-01-10 (×2): 10 mg via INTRAVENOUS

## 2014-01-10 MED ORDER — OXYCODONE HCL 5 MG PO TABS
5.0000 mg | ORAL_TABLET | Freq: Once | ORAL | Status: AC | PRN
  Administered 2014-01-10: 5 mg via ORAL

## 2014-01-10 MED ORDER — ONDANSETRON HCL 4 MG/2ML IJ SOLN
4.0000 mg | INTRAMUSCULAR | Status: DC | PRN
  Administered 2014-01-10: 4 mg via INTRAVENOUS
  Filled 2014-01-10: qty 2

## 2014-01-10 MED ORDER — MENTHOL 3 MG MT LOZG
1.0000 | LOZENGE | OROMUCOSAL | Status: DC | PRN
  Filled 2014-01-10: qty 9

## 2014-01-10 MED ORDER — MIDAZOLAM HCL 5 MG/5ML IJ SOLN
INTRAMUSCULAR | Status: DC | PRN
  Administered 2014-01-10: 2 mg via INTRAVENOUS

## 2014-01-10 MED ORDER — HYDROMORPHONE HCL PF 1 MG/ML IJ SOLN
1.0000 mg | INTRAMUSCULAR | Status: DC | PRN
  Administered 2014-01-10: 1.5 mg via INTRAMUSCULAR
  Administered 2014-01-11: 1 mg via INTRAMUSCULAR
  Filled 2014-01-10: qty 1
  Filled 2014-01-10: qty 2

## 2014-01-10 MED ORDER — PHENOL 1.4 % MT LIQD
1.0000 | OROMUCOSAL | Status: DC | PRN
  Administered 2014-01-10: 1 via OROMUCOSAL
  Filled 2014-01-10: qty 177

## 2014-01-10 MED ORDER — DEXAMETHASONE SODIUM PHOSPHATE 4 MG/ML IJ SOLN
INTRAMUSCULAR | Status: DC | PRN
  Administered 2014-01-10: 10 mg via INTRAVENOUS

## 2014-01-10 MED ORDER — KCL IN DEXTROSE-NACL 20-5-0.45 MEQ/L-%-% IV SOLN
INTRAVENOUS | Status: AC
  Filled 2014-01-10: qty 1000

## 2014-01-10 MED ORDER — ACETAMINOPHEN 325 MG PO TABS: 650.0000 mg | ORAL_TABLET | ORAL | Status: DC | PRN

## 2014-01-10 MED ORDER — DOCUSATE SODIUM 100 MG PO CAPS
100.0000 mg | ORAL_CAPSULE | Freq: Two times a day (BID) | ORAL | Status: DC
  Administered 2014-01-10: 100 mg via ORAL
  Filled 2014-01-10 (×3): qty 1

## 2014-01-10 MED ORDER — SODIUM CHLORIDE 0.9 % IJ SOLN
3.0000 mL | Freq: Two times a day (BID) | INTRAMUSCULAR | Status: DC
  Administered 2014-01-10: 3 mL via INTRAVENOUS

## 2014-01-10 MED ORDER — OXYCODONE HCL 5 MG/5ML PO SOLN: 5.0000 mg | Freq: Once | ORAL | Status: AC | PRN

## 2014-01-10 MED ORDER — GLYCOPYRROLATE 0.2 MG/ML IJ SOLN
INTRAMUSCULAR | Status: DC | PRN
  Administered 2014-01-10: .6 mg via INTRAVENOUS

## 2014-01-10 MED ORDER — ACETAMINOPHEN 650 MG RE SUPP: 650.0000 mg | RECTAL | Status: DC | PRN

## 2014-01-10 SURGICAL SUPPLY — 62 items
ALLOGRAFT CERV LORD 11X14X7 (Spacer) ×3 IMPLANT
BAG DECANTER FOR FLEXI CONT (MISCELLANEOUS) ×3 IMPLANT
BENZOIN TINCTURE PRP APPL 2/3 (GAUZE/BANDAGES/DRESSINGS) ×3 IMPLANT
BIT DRILL TRINICA 2.3MM (BIT) ×1 IMPLANT
BNDG COHESIVE 3X5 WHT NS (GAUZE/BANDAGES/DRESSINGS) ×3 IMPLANT
BRUSH SCRUB EZ PLAIN DRY (MISCELLANEOUS) ×3 IMPLANT
BUR MATCHSTICK NEURO 3.0 LAGG (BURR) ×3 IMPLANT
CANISTER SUCT 3000ML (MISCELLANEOUS) ×3 IMPLANT
CLOSURE WOUND 1/2 X4 (GAUZE/BANDAGES/DRESSINGS) ×1
CONT SPEC 4OZ CLIKSEAL STRL BL (MISCELLANEOUS) ×3 IMPLANT
DRAPE C-ARM 42X72 X-RAY (DRAPES) ×6 IMPLANT
DRAPE LAPAROTOMY 100X72 PEDS (DRAPES) ×3 IMPLANT
DRAPE MICROSCOPE LEICA (MISCELLANEOUS) ×3 IMPLANT
DRAPE POUCH INSTRU U-SHP 10X18 (DRAPES) ×3 IMPLANT
DRAPE SURG 17X23 STRL (DRAPES) ×6 IMPLANT
DRILL BIT TRINICA 2.3MM (BIT) ×3
DRSG TELFA 3X8 NADH (GAUZE/BANDAGES/DRESSINGS) ×3 IMPLANT
DURAPREP 6ML APPLICATOR 50/CS (WOUND CARE) ×3 IMPLANT
ELECT COATED BLADE 2.86 ST (ELECTRODE) ×3 IMPLANT
ELECT REM PT RETURN 9FT ADLT (ELECTROSURGICAL) ×3
ELECTRODE REM PT RTRN 9FT ADLT (ELECTROSURGICAL) ×1 IMPLANT
GAUZE SPONGE 4X4 12PLY STRL (GAUZE/BANDAGES/DRESSINGS) ×3 IMPLANT
GAUZE SPONGE 4X4 16PLY XRAY LF (GAUZE/BANDAGES/DRESSINGS) IMPLANT
GLOVE BIOGEL PI IND STRL 7.5 (GLOVE) ×1 IMPLANT
GLOVE BIOGEL PI INDICATOR 7.5 (GLOVE) ×2
GLOVE ECLIPSE 7.0 STRL STRAW (GLOVE) ×3 IMPLANT
GLOVE ECLIPSE 8.0 STRL XLNG CF (GLOVE) ×3 IMPLANT
GLOVE ECLIPSE 8.5 STRL (GLOVE) ×3 IMPLANT
GLOVE EXAM NITRILE LRG STRL (GLOVE) IMPLANT
GLOVE EXAM NITRILE XL STR (GLOVE) IMPLANT
GLOVE EXAM NITRILE XS STR PU (GLOVE) IMPLANT
GLOVE INDICATOR 7.5 STRL GRN (GLOVE) ×3 IMPLANT
GLOVE INDICATOR 8.5 STRL (GLOVE) ×3 IMPLANT
GLOVE SURG SS PI 7.0 STRL IVOR (GLOVE) ×6 IMPLANT
GOWN STRL REUS W/ TWL LRG LVL3 (GOWN DISPOSABLE) ×1 IMPLANT
GOWN STRL REUS W/ TWL XL LVL3 (GOWN DISPOSABLE) ×1 IMPLANT
GOWN STRL REUS W/TWL 2XL LVL3 (GOWN DISPOSABLE) ×6 IMPLANT
GOWN STRL REUS W/TWL LRG LVL3 (GOWN DISPOSABLE) ×2
GOWN STRL REUS W/TWL XL LVL3 (GOWN DISPOSABLE) ×2
HALTER HD/CHIN CERV TRACTION D (MISCELLANEOUS) ×3 IMPLANT
KIT BASIN OR (CUSTOM PROCEDURE TRAY) ×3 IMPLANT
KIT ROOM TURNOVER OR (KITS) ×3 IMPLANT
NEEDLE SPNL 20GX3.5 QUINCKE YW (NEEDLE) ×3 IMPLANT
NS IRRIG 1000ML POUR BTL (IV SOLUTION) ×3 IMPLANT
PACK LAMINECTOMY NEURO (CUSTOM PROCEDURE TRAY) ×3 IMPLANT
PAD ARMBOARD 7.5X6 YLW CONV (MISCELLANEOUS) ×3 IMPLANT
PATTIES SURGICAL .25X.25 (GAUZE/BANDAGES/DRESSINGS) IMPLANT
PATTIES SURGICAL .75X.75 (GAUZE/BANDAGES/DRESSINGS) ×3 IMPLANT
PLATE 22MM (Plate) ×3 IMPLANT
PUTTY BONE GRAFT KIT 2.5ML (Bone Implant) ×3 IMPLANT
RUBBERBAND STERILE (MISCELLANEOUS) ×6 IMPLANT
SCREW SELF DRILL FIXED 14MM (Screw) ×12 IMPLANT
SPONGE INTESTINAL PEANUT (DISPOSABLE) ×3 IMPLANT
SPONGE SURGIFOAM ABS GEL SZ50 (HEMOSTASIS) ×3 IMPLANT
STRIP CLOSURE SKIN 1/2X4 (GAUZE/BANDAGES/DRESSINGS) ×2 IMPLANT
SUT PDS AB 5-0 P3 18 (SUTURE) ×3 IMPLANT
SUT VIC AB 3-0 CP2 18 (SUTURE) ×3 IMPLANT
SYR 20ML ECCENTRIC (SYRINGE) IMPLANT
TOWEL OR 17X24 6PK STRL BLUE (TOWEL DISPOSABLE) ×3 IMPLANT
TOWEL OR 17X26 10 PK STRL BLUE (TOWEL DISPOSABLE) ×3 IMPLANT
TRAP SPECIMEN MUCOUS 40CC (MISCELLANEOUS) IMPLANT
WATER STERILE IRR 1000ML POUR (IV SOLUTION) ×3 IMPLANT

## 2014-01-10 NOTE — Progress Notes (Signed)
Pt with red right eye, and states its hurting, Dr Katrinka Blazing  Aware and orders noted

## 2014-01-10 NOTE — Op Note (Signed)
Preop diagnosis: Herniated disc C6-7 left with central and foraminal stenosis Postop diagnosis: Same Procedure: C6-7 decompressive anterior cervical discectomy with interbody fusion and Trinica anterior cervical plating Surgeon: Jamilla Galli Assistant: Conchita Paris  After being placed in the supine position and 10 pounds halter traction the patient's neck was prepped and draped in usual sterile fashion. Localizing fluoroscopy was used prior to incision to identify the appropriate level. Transverse incision was made in the right anterior neck started midline and headed towards the medial aspect of the sternocleidomastoid muscle. The platysma muscle was incised transversely. The natural fascial plane between the strap muscles medially and the sternal cremaster laterally was identified and followed down to the anterior aspect the cervical spine. Longus Cole muscles were identified split in the midline and Triple-A bilaterally with unipolar coagulation and Kitner dissection. Subcutaneous tract was placed for exposure and x-ray showed approach the appropriate level. Using a 15 blade the Montrose disc at C6-7 was incised. Using pituitary rongeurs and curettes approximately 90% of the disc material was removed. High-speed drill was used to widen the interspace and bony shavings were saved for use later in the case. Microscope was draped brought in the field and used for the remainder of the case. Using microdissection technique the remainder of the disc material down the posterior longitudinal ligament was removed. Ligament was incised transversely and the cut edges removed a Kerrison punch. Thorough decompression was carried then carried out on the spinal dura the foramen bilaterally until the C7 nerve roots well visualized well decompressed bilaterally. At this time inspection was carried out all directions for any evidence of residual compression and none could be identified. Irrigation was carried out and any bleeding control  proper coagulation Gelfoam. Measurements were taken and a 7 mm lordotic bone graft was chosen. This was then impacted without difficulty and fossae showed to be in good position. An appropriately length Trinica anterior cervical plate was then chosen. Under fluoroscopic guidance drill holes were placed followed by placing of 14 mm screws x4. Locking mechanism was rotated locked position final fossae showed good position of the plates screws and plugs. Irrigation carried out any bleeding control proper coagulation. The was then closed with inverted Vicryl on the platysma muscle and subcuticular layer. Steri-Strips were placed on the skin a sterile dressing was applied. The patient was extubated and taken to recovery in stable condition.

## 2014-01-10 NOTE — Plan of Care (Signed)
Problem: Consults Goal: Diagnosis - Spinal Surgery Outcome: Completed/Met Date Met:  01/10/14 Cervical Spine Fusion

## 2014-01-10 NOTE — Anesthesia Preprocedure Evaluation (Addendum)
Anesthesia Evaluation  Patient identified by MRN, date of birth, ID band Patient awake    Reviewed: Allergy & Precautions, H&P , NPO status , Patient's Chart, lab work & pertinent test results  History of Anesthesia Complications (+) PONV  Airway Mallampati: II TM Distance: >3 FB Neck ROM: Full    Dental no notable dental hx. (+) Edentulous Upper, Dental Advisory Given   Pulmonary Current Smoker,  breath sounds clear to auscultation  Pulmonary exam normal       Cardiovascular negative cardio ROS  Rhythm:Regular Rate:Normal     Neuro/Psych Anxiety negative neurological ROS     GI/Hepatic negative GI ROS, Neg liver ROS,   Endo/Other  negative endocrine ROS  Renal/GU negative Renal ROS  negative genitourinary   Musculoskeletal   Abdominal   Peds  Hematology negative hematology ROS (+)   Anesthesia Other Findings   Reproductive/Obstetrics negative OB ROS                         Anesthesia Physical Anesthesia Plan  ASA: II  Anesthesia Plan: General   Post-op Pain Management:    Induction: Intravenous  Airway Management Planned: Oral ETT  Additional Equipment:   Intra-op Plan:   Post-operative Plan: Extubation in OR  Informed Consent: I have reviewed the patients History and Physical, chart, labs and discussed the procedure including the risks, benefits and alternatives for the proposed anesthesia with the patient or authorized representative who has indicated his/her understanding and acceptance.   Dental advisory given and Dental Advisory Given  Plan Discussed with: CRNA, Surgeon and Anesthesiologist  Anesthesia Plan Comments:        Anesthesia Quick Evaluation

## 2014-01-10 NOTE — Transfer of Care (Signed)
Immediate Anesthesia Transfer of Care Note  Patient: Justin Raymond  Procedure(s) Performed: Procedure(s): CERVICAL SIX TO SEVEN ANTERIOR CERVICAL DECOMPRESSION/DISCECTOMY FUSION 1 LEVEL (N/A)  Patient Location: PACU  Anesthesia Type:General  Level of Consciousness: awake, alert  and oriented  Airway & Oxygen Therapy: Patient Spontanous Breathing  Post-op Assessment: Report given to PACU RN  Post vital signs: Reviewed and stable  Complications: No apparent anesthesia complications

## 2014-01-10 NOTE — H&P (Signed)
  Justin Raymond is an 48 y.o. male.   Chief Complaint: Left arm pain HPI: The patient is a 48 year old gentleman who was evaluated in the office for left arm pain with numbness. He woke up the probability side and bent. With the margin the tear he was given prednisone Vicodin without relief. The tumor sodium x-rays were done he was referred to medical Dr. He was then given oxycodone without relief followed by flexor and Valium. He was then referred for evaluation we do not have any imaging studies. After evaluation the office the patient underwent MRI scanning which showed a disc herniation at C6-7 towards the left and right side. As well as clinical presentation. Discussed the options the patient requested surgery now comes for an anterior cervical discectomy with fusion and plating. I had a long discussion with him regarding the risks and benefits of surgical intervention. The risks discussed include but are not limited to bleeding infection weakness numbness paralysis spinal fluid leak coma quadriplegia hoarseness and death. We have discussed alternative methods of all the risks and benefits of nonintervention. He's had the opportunity to ask numerous questions and appears to understand. With this information in hand he has requested we proceed with surgery and he is admitted at this time for his procedure.  Past Medical History  Diagnosis Date  . 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 PONV (postoperative nausea and vomiting)   . History of kidney stones   . Arthritis   . Anxiety     Past Surgical History  Procedure Laterality Date  . Femur surgery Right 85,97    femur plate broke and had another surgery  . Hernia repair Right 2000    last one  . Appendectomy  2002  . Vasectomy      hospitilized afterwise for epydidimitis  . Foot surgery Right 97    No family history on file. Social History:  reports that he has been smoking  Cigarettes.  He has been smoking about 1.00 pack per day. He does not have any smokeless tobacco history on file. He reports that he drinks alcohol. He reports that he uses illicit drugs (Marijuana).  Allergies: No Known Allergies  Medications Prior to Admission  Medication Sig Dispense Refill  . diazepam (VALIUM) 10 MG tablet Take 10 mg by mouth every 8 (eight) hours as needed for anxiety.      Marland Kitchen HYDROmorphone (DILAUDID) 4 MG tablet Take 4 mg by mouth every 4 (four) hours as needed for severe pain (pain).        No results found for this or any previous visit (from the past 48 hour(s)). No results found.  Positive for neck and arm pain shortness of breath joint pain and anxiety  Blood pressure 128/85, pulse 64, temperature 97.6 F (36.4 C), temperature source Oral, resp. rate 20, height  (1.803 m), weight 86.75 kg (191 lb 4 oz), SpO2 98.00%.  The patient is awake alert and oriented. His difficulty with performing neurologic exam because of his pain and appears to have decreased reflexes and normal strength and some possible mild weakness of the left bicep muscle Assessment/Plan Impression is that of herniated disc at C6-7. The plan is for a C6-7 anterior cervical discectomy with fusion and plating.  Reinaldo Meeker, MD 01/10/2014, 1:24 PM

## 2014-01-10 NOTE — Anesthesia Postprocedure Evaluation (Signed)
  Anesthesia Post-op Note  Patient: Justin Raymond  Procedure(s) Performed: Procedure(s): CERVICAL SIX TO SEVEN ANTERIOR CERVICAL DECOMPRESSION/DISCECTOMY FUSION 1 LEVEL (N/A)  Patient Location: PACU  Anesthesia Type:General  Level of Consciousness: awake and alert   Airway and Oxygen Therapy: Patient Spontanous Breathing  Post-op Pain: mild  Post-op Assessment: Post-op Vital signs reviewed, Patient's Cardiovascular Status Stable and Respiratory Function Stable  Post-op Vital Signs: Reviewed  Filed Vitals:   01/10/14 1729  BP:   Pulse:   Temp: 36.7 C  Resp:     Complications: No apparent anesthesia complications

## 2014-01-11 DIAGNOSIS — M5020 Other cervical disc displacement, unspecified cervical region: Secondary | ICD-10-CM | POA: Diagnosis not present

## 2014-01-11 MED ORDER — HYDROMORPHONE HCL 2 MG PO TABS: 2.0000 mg | ORAL_TABLET | ORAL | Status: DC | PRN

## 2014-01-11 NOTE — Progress Notes (Signed)
Patient alert and oriented, mae's well, voiding adequate amount of urine, swallowing without difficulty, c/o moderate pain and meds given prior to discharged. Patient discharged home with family. Script and discharged instructions given to patient. Patient and family stated understanding of instructions given. Patient has appointment for follow-up with MD. Arley Phenix RN

## 2014-01-11 NOTE — Discharge Summary (Signed)
Physician Discharge Summary  Patient ID: Justin Raymond MRN: [id number] DOB/AGE: [birth year] 48 y.o.  Admit date: 01/10/2014 Discharge date: 01/11/2014  Admission Diagnoses:  Discharge Diagnoses:  Active Problems:   Herniated cervical disc   Discharged Condition: good  Hospital Course: Surgery yesterday for HNP C 67. Did well. Marked improvement in pain. No new neuro issues. Ambulated well. Home pod 1, specific instructions given.  Consults: None  Significant Diagnostic Studies: none  Treatments: surgery: C 67 acdf with plating  Discharge Exam: Blood pressure 116/78, pulse 63, temperature 97.7 F (36.5 C), temperature source Oral, resp. rate 18, height  (1.803 m), weight 86.75 kg (191 lb 4 oz), SpO2 94.00%. Incision/Wound:clean and dry; no new neuro deficits  Disposition: 01-Home or Self Care     Medication List    ASK your doctor about these medications       diazepam 10 MG tablet  Commonly known as:  VALIUM  Take 10 mg by mouth every 8 (eight) hours as needed for anxiety.     HYDROmorphone 4 MG tablet  Commonly known as:  DILAUDID  Take 4 mg by mouth every 4 (four) hours as needed for severe pain (pain).         At home rest most of the time. Get up 9 or 10 times each day and take a 15 or 20 minute walk. No riding in the car and to your first postoperative appointment. If you have neck surgery you may shower from the chest down starting on the third postoperative day. If you had back surgery he may start showering on the third postoperative day with saran wrap wrapped around your incisional area 3 times. After the shower remove the saran wrap. Take pain medicine as needed and other medications as instructed. Call my office for an appointment.  SignedReinaldo Meeker, MD 01/11/2014, 8:41 AM

## 2014-01-16 ENCOUNTER — Encounter (HOSPITAL_COMMUNITY): Payer: Self-pay | Admitting: Neurosurgery

## 2014-01-27 ENCOUNTER — Emergency Department (HOSPITAL_COMMUNITY)
Admission: EM | Admit: 2014-01-27 | Discharge: 2014-01-27 | Disposition: A | Discharge status: Home / Self Care | Payer: No Typology Code available for payment source | Attending: Emergency Medicine | Admitting: Emergency Medicine

## 2014-01-27 ENCOUNTER — Emergency Department (HOSPITAL_COMMUNITY): Payer: No Typology Code available for payment source

## 2014-01-27 ENCOUNTER — Encounter (HOSPITAL_COMMUNITY): Payer: Self-pay | Admitting: Emergency Medicine

## 2014-01-27 DIAGNOSIS — Z720 Tobacco use: Secondary | ICD-10-CM | POA: Diagnosis not present

## 2014-01-27 DIAGNOSIS — Z87442 Personal history of urinary calculi: Secondary | ICD-10-CM | POA: Insufficient documentation

## 2014-01-27 DIAGNOSIS — Z87828 Personal history of other (healed) physical injury and trauma: Secondary | ICD-10-CM | POA: Insufficient documentation

## 2014-01-27 DIAGNOSIS — Z8739 Personal history of other diseases of the musculoskeletal system and connective tissue: Secondary | ICD-10-CM | POA: Diagnosis not present

## 2014-01-27 DIAGNOSIS — Z8659 Personal history of other mental and behavioral disorders: Secondary | ICD-10-CM | POA: Diagnosis not present

## 2014-01-27 DIAGNOSIS — R569 Unspecified convulsions: Secondary | ICD-10-CM | POA: Diagnosis not present

## 2014-01-27 LAB — CBC WITH DIFFERENTIAL/PLATELET
BASOS ABS: 0 10*3/uL (ref 0.0–0.1)
BASOS PCT: 0 % (ref 0–1)
EOS ABS: 0.1 10*3/uL (ref 0.0–0.7)
Eosinophils Relative: 1 % (ref 0–5)
HCT: 43.8 % (ref 39.0–52.0)
HEMOGLOBIN: 15.2 g/dL (ref 13.0–17.0)
Lymphocytes Relative: 29 % (ref 12–46)
Lymphs Abs: 2.5 10*3/uL (ref 0.7–4.0)
MCH: 31.3 pg (ref 26.0–34.0)
MCHC: 34.7 g/dL (ref 30.0–36.0)
MCV: 90.1 fL (ref 78.0–100.0)
MONOS PCT: 7 % (ref 3–12)
Monocytes Absolute: 0.6 10*3/uL (ref 0.1–1.0)
NEUTROS ABS: 5.2 10*3/uL (ref 1.7–7.7)
NEUTROS PCT: 63 % (ref 43–77)
Platelets: 292 10*3/uL (ref 150–400)
RBC: 4.86 MIL/uL (ref 4.22–5.81)
RDW: 13 % (ref 11.5–15.5)
WBC: 8.3 10*3/uL (ref 4.0–10.5)

## 2014-01-27 LAB — COMPREHENSIVE METABOLIC PANEL
ALBUMIN: 3.5 g/dL (ref 3.5–5.2)
ALK PHOS: 83 U/L (ref 39–117)
ALT: 24 U/L (ref 0–53)
ANION GAP: 11 (ref 5–15)
AST: 15 U/L (ref 0–37)
BUN: 9 mg/dL (ref 6–23)
CHLORIDE: 104 meq/L (ref 96–112)
CO2: 27 mEq/L (ref 19–32)
CREATININE: 0.82 mg/dL (ref 0.50–1.35)
Calcium: 9.4 mg/dL (ref 8.4–10.5)
GFR calc Af Amer: >90 mL/min (ref >90)
GFR calc non Af Amer: >90 mL/min (ref >90)
Glucose, Bld: 90 mg/dL (ref 70–99)
POTASSIUM: 3.8 meq/L (ref 3.7–5.3)
Sodium: 142 mEq/L (ref 137–147)
Total Bilirubin: 0.2 mg/dL — ABNORMAL LOW (ref 0.3–1.2)
Total Protein: 7.1 g/dL (ref 6.0–8.3)

## 2014-01-27 LAB — URINALYSIS, ROUTINE W REFLEX MICROSCOPIC
Bilirubin Urine: NEGATIVE
GLUCOSE, UA: NEGATIVE mg/dL
Hgb urine dipstick: NEGATIVE
KETONES UR: NEGATIVE mg/dL
Leukocytes, UA: NEGATIVE
Nitrite: NEGATIVE
PH: 7.5 (ref 5.0–8.0)
Protein, ur: NEGATIVE mg/dL
Specific Gravity, Urine: 1.021 (ref 1.005–1.030)
Urobilinogen, UA: 0.2 mg/dL (ref 0.0–1.0)

## 2014-01-27 LAB — RAPID URINE DRUG SCREEN, HOSP PERFORMED
AMPHETAMINES: NOT DETECTED
Barbiturates: NOT DETECTED
Benzodiazepines: NOT DETECTED
Cocaine: NOT DETECTED
OPIATES: NOT DETECTED
Tetrahydrocannabinol: POSITIVE — AB

## 2014-01-27 LAB — ETHANOL: Alcohol, Ethyl (B): <11 mg/dL (ref 0–11)

## 2014-01-27 NOTE — ED Provider Notes (Signed)
CSN: [id number]     Arrival date & time 01/27/14  1505 History   First MD Initiated Contact with Patient 01/27/14 1509     Chief Complaint  Patient presents with  . Weakness  . Seizure-like activity     HPI The patient presents to the emergency room with a complaint of not feeling well. Patient has a history of a recent neck surgery, anterior cervical decompression/discectomy with fusion,  performed on September 15. Patient states he was told not to do a lot of activity following his neck surgery.  His past Tuesday he decided to go to some friends house. He had a few beers and was playing poker. Patient was not supposed to drive he decided to drive home that evening. On the way home there was an ambulance with a lot of flashing lights. He had a period of unconsciousness following that with shaking. His friend told him it looked like he was having a seizure.  Ever since Tuesday he has not felt well. He had a few episodes of vomiting on Wednesday. He denies having a headache but he feels his heart and something isn't right. She has body aches but has not measured a fever. He denies any abdominal pain. He denies any neck pain other than the typical soreness after his surgery. He denies any focal numbness or weakness. Patient called his neck surgeon today and was told to come to mentioned to be checked out. Past Medical History  Diagnosis Date  . 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 PONV (postoperative nausea and vomiting)   . History of kidney stones   . Arthritis   . Anxiety    Past Surgical History  Procedure Laterality Date  . Femur surgery Right 85,97    femur plate broke and had another surgery  . Hernia repair Right 2000    last one  . Appendectomy  2002  . Vasectomy      hospitilized afterwise for epydidimitis  . Foot surgery Right 97  . Anterior cervical decomp/discectomy fusion N/A 01/10/2014    Procedure: CERVICAL SIX TO  SEVEN ANTERIOR CERVICAL DECOMPRESSION/DISCECTOMY FUSION 1 LEVEL;  Surgeon: Reinaldo Meekerandy O Kritzer, MD;  Location: [facility];  Service: Neurosurgery;  Laterality: N/A;   History reviewed. No pertinent family history. History  Substance Use Topics  . Smoking status: Current Every Day Smoker -- 1.00 packs/day    Types: Cigarettes  . Smokeless tobacco: Not on file  . Alcohol Use: Yes     Comment: occ    Review of Systems  All other systems reviewed and are negative.     Allergies  Review of patient's allergies indicates no known allergies.  Home Medications   Prior to Admission medications   Not on File   BP 157/103  Pulse 63  Resp 14  SpO2 100% Physical Exam  Nursing note and vitals reviewed. Constitutional: He is oriented to person, place, and time. He appears well-developed and well-nourished. No distress.  HENT:  Head: Normocephalic and atraumatic.  Right Ear: External ear normal.  Left Ear: External ear normal.  Mouth/Throat: Oropharynx is clear and moist.  Well healed scar anterior neck without erythema or swelling  Eyes: Conjunctivae are normal. Right eye exhibits no discharge. Left eye exhibits no discharge. No scleral icterus.  Neck: Neck supple. No tracheal deviation present.  Cardiovascular: Normal rate, regular rhythm and intact distal pulses.   Pulmonary/Chest: Effort normal and breath sounds normal.  No stridor. No respiratory distress. He has no wheezes. He has no rales.  Abdominal: Soft. Bowel sounds are normal. He exhibits no distension. There is no tenderness. There is no rebound and no guarding.  Musculoskeletal: He exhibits no edema and no tenderness.  Neurological: He is alert and oriented to person, place, and time. He has normal strength. No cranial nerve deficit (No facial droop, extraocular movements intact, tongue midline ) or sensory deficit. He exhibits normal muscle tone. He displays no seizure activity. Coordination normal.  No pronator drift  bilateral upper extrem, able to hold both legs off bed for 5 seconds, sensation intact in all extremities, no visual field cuts, no left or right sided neglect, normal finger-nose exam bilaterally, no nystagmus noted   Skin: Skin is warm and dry. No rash noted.  Psychiatric: He has a normal mood and affect.    ED Course  Procedures (including critical care time) Labs Review Labs Reviewed  COMPREHENSIVE METABOLIC PANEL - Abnormal; Notable for the following:    Total Bilirubin 0.2 (*)    All other components within normal limits  URINE RAPID DRUG SCREEN (HOSP PERFORMED) - Abnormal; Notable for the following:    Tetrahydrocannabinol POSITIVE (*)    All other components within normal limits  URINALYSIS, ROUTINE W REFLEX MICROSCOPIC - Abnormal; Notable for the following:    Color, Urine AMBER (*)    APPearance CLOUDY (*)    All other components within normal limits  CBC WITH DIFFERENTIAL  ETHANOL    Imaging Review Ct Head Wo Contrast  01/27/2014   CLINICAL DATA:  48 year old male with 1 episode of seizure like activity 3 days ago. Increasing weakness since that time. Head pain (4 out of 10). Emesis 2 times in the last 3 days.  EXAM: CT HEAD WITHOUT CONTRAST  TECHNIQUE: Contiguous axial images were obtained from the base of the skull through the vertex without intravenous contrast.  COMPARISON:  No priors.  FINDINGS: No acute intracranial abnormalities. Specifically, no evidence of acute intracranial hemorrhage, no definite findings of acute/subacute cerebral ischemia, no mass, mass effect, hydrocephalus or abnormal intra or extra-axial fluid collections. Visualized paranasal sinuses and mastoids are well pneumatized. No acute displaced skull fractures are identified.  IMPRESSION: *No acute intracranial abnormalities. *The appearance of the brain is normal.   Electronically Signed   By: Trudie Reed M.D.   On: 01/27/2014 16:51     EKG Interpretation   Date/Time:  Friday January 27 2014  15:14:22 EDT Ventricular Rate:  66 PR Interval:  211 QRS Duration: 109 QT Interval:  402 QTC Calculation: 421 R Axis:   95 Text Interpretation:  Sinus rhythm Prolonged PR interval Borderline right  axis deviation No significant change since last tracing Confirmed by Tanisha Lutes   MD-J, Kindell Strada ([id number]) on 01/27/2014 3:34:58 PM      MDM   Final diagnoses:  Seizure-like activity    Patient's exam, laboratory tests and x-rays are reassuring.  His postoperative site does not look infected.  It is possible the patient had a seizure 3 days ago but has not had any episodes since.  He did notice that the flashing lights were bothering before the episode.  At this point, he is stable for discharge.  I discussed warning signs and precautions. Suggested he followup with his primary doctor to discuss a possible neurology referral.    Linwood Dibbles, MD 01/27/14 [phone]
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
## 2014-01-27 NOTE — ED Notes (Signed)
Pt unable to urinate at this time. Will notify staff when able 

## 2014-01-27 NOTE — Discharge Instructions (Signed)

## 2015-09-22 ENCOUNTER — Emergency Department (HOSPITAL_BASED_OUTPATIENT_CLINIC_OR_DEPARTMENT_OTHER)
Admission: EM | Admit: 2015-09-22 | Discharge: 2015-09-22 | Disposition: A | Discharge status: Home / Self Care | Payer: Self-pay | Attending: Emergency Medicine | Admitting: Emergency Medicine

## 2015-09-22 ENCOUNTER — Encounter (HOSPITAL_BASED_OUTPATIENT_CLINIC_OR_DEPARTMENT_OTHER): Payer: Self-pay | Admitting: *Deleted

## 2015-09-22 ENCOUNTER — Emergency Department (HOSPITAL_BASED_OUTPATIENT_CLINIC_OR_DEPARTMENT_OTHER): Payer: Self-pay

## 2015-09-22 DIAGNOSIS — F17210 Nicotine dependence, cigarettes, uncomplicated: Secondary | ICD-10-CM | POA: Insufficient documentation

## 2015-09-22 DIAGNOSIS — M1990 Unspecified osteoarthritis, unspecified site: Secondary | ICD-10-CM | POA: Insufficient documentation

## 2015-09-22 DIAGNOSIS — N200 Calculus of kidney: Secondary | ICD-10-CM

## 2015-09-22 LAB — BASIC METABOLIC PANEL
ANION GAP: 8 (ref 5–15)
BUN: 11 mg/dL (ref 6–20)
CO2: 25 mmol/L (ref 22–32)
Calcium: 8.7 mg/dL — ABNORMAL LOW (ref 8.9–10.3)
Chloride: 106 mmol/L (ref 101–111)
Creatinine, Ser: 0.75 mg/dL (ref 0.61–1.24)
GFR calc Af Amer: >60 mL/min (ref >60)
Glucose, Bld: 125 mg/dL — ABNORMAL HIGH (ref 65–99)
Potassium: 3.1 mmol/L — ABNORMAL LOW (ref 3.5–5.1)
Sodium: 139 mmol/L (ref 135–145)

## 2015-09-22 LAB — CBC WITH DIFFERENTIAL/PLATELET
BASOS ABS: 0 10*3/uL (ref 0.0–0.1)
Basophils Relative: 0 %
Eosinophils Absolute: 0.3 10*3/uL (ref 0.0–0.7)
Eosinophils Relative: 3 %
HCT: 40.8 % (ref 39.0–52.0)
Hemoglobin: 14.1 g/dL (ref 13.0–17.0)
LYMPHS ABS: 2.1 10*3/uL (ref 0.7–4.0)
Lymphocytes Relative: 24 %
MCH: 32 pg (ref 26.0–34.0)
MCHC: 34.6 g/dL (ref 30.0–36.0)
MCV: 92.5 fL (ref 78.0–100.0)
Monocytes Absolute: 0.6 10*3/uL (ref 0.1–1.0)
Monocytes Relative: 7 %
NEUTROS ABS: 5.6 10*3/uL (ref 1.7–7.7)
Neutrophils Relative %: 66 %
PLATELETS: ADEQUATE 10*3/uL (ref 150–400)
RBC: 4.41 MIL/uL (ref 4.22–5.81)
RDW: 13.3 % (ref 11.5–15.5)
Smear Review: ADEQUATE
WBC: 8.6 10*3/uL (ref 4.0–10.5)

## 2015-09-22 LAB — URINALYSIS, ROUTINE W REFLEX MICROSCOPIC
Glucose, UA: NEGATIVE mg/dL
KETONES UR: 15 mg/dL — AB
Leukocytes, UA: NEGATIVE
NITRITE: NEGATIVE
PROTEIN: NEGATIVE mg/dL
Specific Gravity, Urine: 1.03 (ref 1.005–1.030)
pH: 5.5 (ref 5.0–8.0)

## 2015-09-22 LAB — URINE MICROSCOPIC-ADD ON: Bacteria, UA: NONE SEEN

## 2015-09-22 MED ORDER — ONDANSETRON HCL 4 MG/2ML IJ SOLN
INTRAMUSCULAR | Status: AC
  Administered 2015-09-22: 4 mg via INTRAVENOUS
  Filled 2015-09-22: qty 2

## 2015-09-22 MED ORDER — KETOROLAC TROMETHAMINE 30 MG/ML IJ SOLN
30.0000 mg | Freq: Once | INTRAMUSCULAR | Status: AC
  Administered 2015-09-22: 30 mg via INTRAVENOUS
  Filled 2015-09-22: qty 1

## 2015-09-22 MED ORDER — OXYCODONE-ACETAMINOPHEN 5-325 MG PO TABS
2.0000 | ORAL_TABLET | Freq: Once | ORAL | Status: AC
  Administered 2015-09-22: 2 via ORAL
  Filled 2015-09-22: qty 2

## 2015-09-22 MED ORDER — ONDANSETRON HCL 4 MG/2ML IJ SOLN
4.0000 mg | Freq: Once | INTRAMUSCULAR | Status: AC
  Administered 2015-09-22: 4 mg via INTRAVENOUS
  Filled 2015-09-22: qty 2

## 2015-09-22 MED ORDER — ONDANSETRON HCL 4 MG/2ML IJ SOLN
4.0000 mg | Freq: Once | INTRAMUSCULAR | Status: AC
  Administered 2015-09-22: 4 mg via INTRAVENOUS

## 2015-09-22 MED ORDER — MORPHINE SULFATE (PF) 4 MG/ML IV SOLN
4.0000 mg | Freq: Once | INTRAVENOUS | Status: AC
  Administered 2015-09-22: 4 mg via INTRAVENOUS
  Filled 2015-09-22: qty 1

## 2015-09-22 NOTE — Discharge Instructions (Signed)
Kidney Stones °Kidney stones (urolithiasis) are deposits that form inside your kidneys. The intense pain is caused by the stone moving through the urinary tract. When the stone moves, the ureter goes into spasm around the stone. The stone is usually passed in the urine.  °CAUSES  °· A disorder that makes certain neck glands produce too much parathyroid hormone (primary hyperparathyroidism). °· A buildup of uric acid crystals, similar to gout in your joints. °· Narrowing (stricture) of the ureter. °· A kidney obstruction present at birth (congenital obstruction). °· Previous surgery on the kidney or ureters. °· Numerous kidney infections. °SYMPTOMS  °· Feeling sick to your stomach (nauseous). °· Throwing up (vomiting). °· Blood in the urine (hematuria). °· Pain that usually spreads (radiates) to the groin. °· Frequency or urgency of urination. °DIAGNOSIS  °· Taking a history and physical exam. °· Blood or urine tests. °· CT scan. °· Occasionally, an examination of the inside of the urinary bladder (cystoscopy) is performed. °TREATMENT  °· Observation. °· Increasing your fluid intake. °· Extracorporeal shock wave lithotripsy--This is a noninvasive procedure that uses shock waves to break up kidney stones. °· Surgery may be needed if you have severe pain or persistent obstruction. There are various surgical procedures. Most of the procedures are performed with the use of small instruments. Only small incisions are needed to accommodate these instruments, so recovery time is minimized. °The size, location, and chemical composition are all important variables that will determine the proper choice of action for you. Talk to your health care provider to better understand your situation so that you will minimize the risk of injury to yourself and your kidney.  °HOME CARE INSTRUCTIONS  °· Drink enough water and fluids to keep your urine clear or pale yellow. This will help you to pass the stone or stone fragments. °· Strain  all urine through the provided strainer. Keep all particulate matter and stones for your health care provider to see. The stone causing the pain may be as small as a grain of salt. It is very important to use the strainer each and every time you pass your urine. The collection of your stone will allow your health care provider to analyze it and verify that a stone has actually passed. The stone analysis will often identify what you can do to reduce the incidence of recurrences. °· Only take over-the-counter or prescription medicines for pain, discomfort, or fever as directed by your health care provider. °· Keep all follow-up visits as told by your health care provider. This is important. °· Get follow-up X-rays if required. The absence of pain does not always mean that the stone has passed. It may have only stopped moving. If the urine remains completely obstructed, it can cause loss of kidney function or even complete destruction of the kidney. It is your responsibility to make sure X-rays and follow-ups are completed. Ultrasounds of the kidney can show blockages and the status of the kidney. Ultrasounds are not associated with any radiation and can be performed easily in a matter of minutes. °· Make changes to your daily diet as told by your health care provider. You may be told to: °¨ Limit the amount of salt that you eat. °¨ Eat 5 or more servings of fruits and vegetables each day. °¨ Limit the amount of meat, poultry, fish, and eggs that you eat. °· Collect a 24-hour urine sample as told by your health care provider. You may need to collect another urine sample every 6-12   months. °SEEK MEDICAL CARE IF: °· You experience pain that is progressive and unresponsive to any pain medicine you have been prescribed. °SEEK IMMEDIATE MEDICAL CARE IF:  °· Pain cannot be controlled with the prescribed medicine. °· You have a fever or shaking chills. °· The severity or intensity of pain increases over 18 hours and is not  relieved by pain medicine. °· You develop a new onset of abdominal pain. °· You feel faint or pass out. °· You are unable to urinate. °  °This information is not intended to replace advice given to you by your health care provider. Make sure you discuss any questions you have with your health care provider. °  °Document Released: 04/14/2005 Document Revised: 01/03/2015 Document Reviewed: 09/15/2012 °Elsevier Interactive Patient Education ©2016 [employer] ° °

## 2015-09-22 NOTE — ED Notes (Signed)
Right flank pain radiating down into groin since 3am.  Nausea.

## 2015-09-22 NOTE — ED Provider Notes (Signed)
CSN: [id number]     Arrival date & time 09/22/15  0414 History   First MD Initiated Contact with Patient 09/22/15 0423     Chief Complaint  Patient presents with  . Flank Pain     (Consider location/radiation/quality/duration/timing/severity/associated sxs/prior Treatment) HPI Comments: Patient is a 50 year old male with history of kidney stones. He presents for evaluation of severe right flank pain and urinary urgency that began approximately one hour prior to presentation. He denies any fevers or chills. He feels nauseated but has not vomited.  Patient is a 50 y.o. male presenting with flank pain. The history is provided by the patient.  Flank Pain This is a new problem. The current episode started less than 1 hour ago. The problem occurs constantly. The problem has been rapidly worsening. Pertinent negatives include no chest pain and no abdominal pain. Nothing aggravates the symptoms. Nothing relieves the symptoms. He has tried nothing for the symptoms. The treatment provided no relief.    Past Medical History  Diagnosis Date  . 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 PONV (postoperative nausea and vomiting)   . History of kidney stones   . Arthritis   . Anxiety    Past Surgical History  Procedure Laterality Date  . Femur surgery Right 85,97    femur plate broke and had another surgery  . Hernia repair Right 2000    last one  . Appendectomy  2002  . Vasectomy      hospitilized afterwise for epydidimitis  . Foot surgery Right 97  . Anterior cervical decomp/discectomy fusion N/A 01/10/2014    Procedure: CERVICAL SIX TO SEVEN ANTERIOR CERVICAL DECOMPRESSION/DISCECTOMY FUSION 1 LEVEL;  Surgeon: Reinaldo Meeker, MD;  Location: [facility];  Service: Neurosurgery;  Laterality: N/A;   History reviewed. No pertinent family history. Social History  Substance Use Topics  . Smoking status: Current Every Day Smoker -- 1.00  packs/day    Types: Cigarettes  . Smokeless tobacco: None  . Alcohol Use: Yes     Comment: occ    Review of Systems  Cardiovascular: Negative for chest pain.  Gastrointestinal: Negative for abdominal pain.  Genitourinary: Positive for flank pain.  All other systems reviewed and are negative.     Allergies  Review of patient's allergies indicates no known allergies.  Home Medications   Prior to Admission medications   Not on File   There were no vitals taken for this visit. Physical Exam  Constitutional: He is oriented to person, place, and time. No distress.  Patient appears very uncomfortable and is very anxious.  HENT:  Head: Normocephalic and atraumatic.  Mouth/Throat: Oropharynx is clear and moist.  Neck: Normal range of motion. Neck supple.  Cardiovascular: Normal rate and regular rhythm.  Exam reveals no friction rub.   No murmur heard. Pulmonary/Chest: Effort normal and breath sounds normal. No respiratory distress. He has no wheezes. He has no rales.  Abdominal: Soft. Bowel sounds are normal. He exhibits no distension. There is tenderness. There is no rebound and no guarding.  There is tenderness to palpation in the right flank.  Musculoskeletal: Normal range of motion. He exhibits no edema.  Neurological: He is alert and oriented to person, place, and time. Coordination normal.  Skin: Skin is warm and dry.  Nursing note and vitals reviewed.   ED Course  Procedures (including critical care time) Labs Review Labs Reviewed  BASIC METABOLIC PANEL  CBC WITH DIFFERENTIAL/PLATELET  URINALYSIS, ROUTINE W REFLEX MICROSCOPIC ([facility])    Imaging Review No results found. I have personally reviewed and evaluated these images and lab results as part of my medical decision-making.   EKG Interpretation None      MDM   Final diagnoses:  None    Patient presents with severe right flank pain and history of kidney stones. His presentation is very  suspicious for recurrence. CT scan reveals a 2 mm stone within the urinary bladder with evidence of previous obstructive uropathy. After medications in the ER he is now feeling better and I believe is appropriate for discharge.    Geoffery Lyonsouglas Daleyssa Loiselle, MD 09/22/15 [phone]

## 2015-12-22 IMAGING — RF DG C-ARM 61-120 MIN
1 series · 1 of 1 positions shown · non-contrast
Comparison: CT, 06/22/2003

CLINICAL DATA: Anterior cervical disc fusion. Localization imaging.

EXAM:
DG C-ARM 61-120 MIN; DG CERVICAL SPINE - 1 VIEW

[Series 1: run · 1 of 1 slices shown]
[im 1/1]
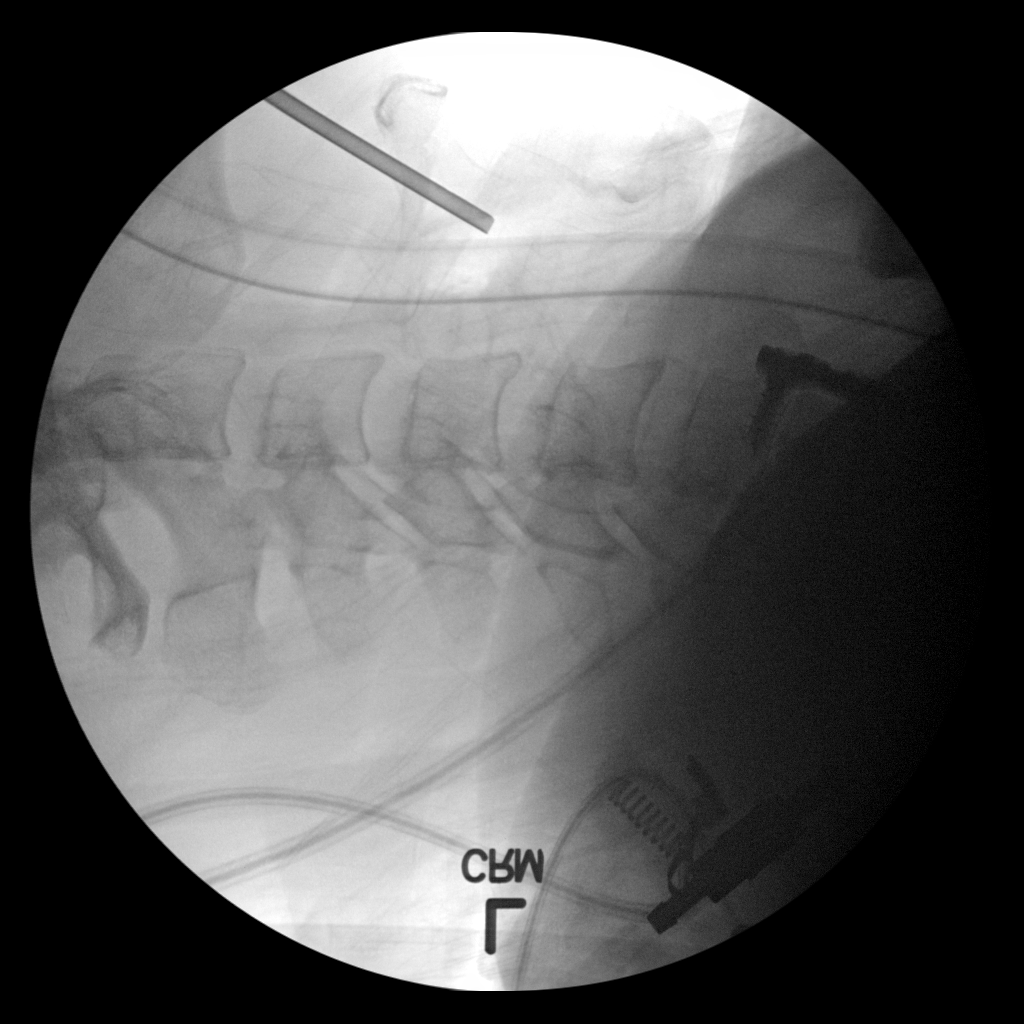

[1 of 1 positions shown; findings below may reference images not displayed]

FINDINGS: An anterior fusion plate spans C6 to T1. Its lower aspect is not
well-defined on the single portable image submitted. Were
visualized, the orthopedic hardware is well-seated and aligned.
Vertebrae are normally aligned. No evidence of an operative
complication.
IMPRESSION: Surgical localization image for anterior cervical disc fusion as
described.

## 2016-11-21 ENCOUNTER — Emergency Department (HOSPITAL_COMMUNITY): Payer: Self-pay

## 2016-11-21 ENCOUNTER — Encounter (HOSPITAL_COMMUNITY): Payer: Self-pay | Admitting: Emergency Medicine

## 2016-11-21 ENCOUNTER — Observation Stay (HOSPITAL_COMMUNITY)
Admission: EM | Admit: 2016-11-21 | Discharge: 2016-11-22 | Disposition: A | Discharge status: Home / Self Care | Payer: Self-pay | Attending: Family Medicine | Admitting: Family Medicine

## 2016-11-21 DIAGNOSIS — I700 Atherosclerosis of aorta: Secondary | ICD-10-CM | POA: Insufficient documentation

## 2016-11-21 DIAGNOSIS — K2900 Acute gastritis without bleeding: Secondary | ICD-10-CM

## 2016-11-21 DIAGNOSIS — M6282 Rhabdomyolysis: Secondary | ICD-10-CM | POA: Insufficient documentation

## 2016-11-21 DIAGNOSIS — F17210 Nicotine dependence, cigarettes, uncomplicated: Secondary | ICD-10-CM | POA: Insufficient documentation

## 2016-11-21 DIAGNOSIS — R112 Nausea with vomiting, unspecified: Secondary | ICD-10-CM | POA: Insufficient documentation

## 2016-11-21 DIAGNOSIS — K920 Hematemesis: Secondary | ICD-10-CM

## 2016-11-21 DIAGNOSIS — N179 Acute kidney failure, unspecified: Principal | ICD-10-CM | POA: Insufficient documentation

## 2016-11-21 DIAGNOSIS — I440 Atrioventricular block, first degree: Secondary | ICD-10-CM | POA: Insufficient documentation

## 2016-11-21 DIAGNOSIS — R001 Bradycardia, unspecified: Secondary | ICD-10-CM | POA: Insufficient documentation

## 2016-11-21 DIAGNOSIS — D72829 Elevated white blood cell count, unspecified: Secondary | ICD-10-CM | POA: Insufficient documentation

## 2016-11-21 LAB — I-STAT TROPONIN, ED: Troponin i, poc: 0 ng/mL (ref 0.00–0.08)

## 2016-11-21 LAB — HEPATIC FUNCTION PANEL
ALT: 32 U/L (ref 17–63)
AST: 38 U/L (ref 15–41)
Albumin: 5.4 g/dL — ABNORMAL HIGH (ref 3.5–5.0)
Alkaline Phosphatase: 70 U/L (ref 38–126)
BILIRUBIN DIRECT: 0.2 mg/dL (ref 0.1–0.5)
BILIRUBIN INDIRECT: 1.3 mg/dL — AB (ref 0.3–0.9)
BILIRUBIN TOTAL: 1.5 mg/dL — AB (ref 0.3–1.2)
Total Protein: 9.3 g/dL — ABNORMAL HIGH (ref 6.5–8.1)

## 2016-11-21 LAB — BASIC METABOLIC PANEL
Anion gap: 15 (ref 5–15)
BUN: 36 mg/dL — AB (ref 6–20)
CALCIUM: 10.9 mg/dL — AB (ref 8.9–10.3)
CO2: 24 mmol/L (ref 22–32)
CREATININE: 2.58 mg/dL — AB (ref 0.61–1.24)
Chloride: 99 mmol/L — ABNORMAL LOW (ref 101–111)
GFR calc Af Amer: 31 mL/min — ABNORMAL LOW (ref >60)
GFR calc non Af Amer: 27 mL/min — ABNORMAL LOW (ref >60)
GLUCOSE: 114 mg/dL — AB (ref 65–99)
Potassium: 3.3 mmol/L — ABNORMAL LOW (ref 3.5–5.1)
SODIUM: 138 mmol/L (ref 135–145)

## 2016-11-21 LAB — CK: CK TOTAL: 1021 U/L — AB (ref 49–397)

## 2016-11-21 LAB — CBC
HCT: 50.2 % (ref 39.0–52.0)
Hemoglobin: 18.4 g/dL — ABNORMAL HIGH (ref 13.0–17.0)
MCH: 31.9 pg (ref 26.0–34.0)
MCHC: 36.7 g/dL — AB (ref 30.0–36.0)
MCV: 87.2 fL (ref 78.0–100.0)
PLATELETS: 352 10*3/uL (ref 150–400)
RBC: 5.76 MIL/uL (ref 4.22–5.81)
RDW: 13.2 % (ref 11.5–15.5)
WBC: 17.3 10*3/uL — AB (ref 4.0–10.5)

## 2016-11-21 LAB — LIPASE, BLOOD: LIPASE: 28 U/L (ref 11–51)

## 2016-11-21 MED ORDER — STERILE WATER FOR INJECTION IJ SOLN
INTRAMUSCULAR | Status: AC
  Administered 2016-11-21: 10 mL
  Filled 2016-11-21: qty 10

## 2016-11-21 MED ORDER — FENTANYL CITRATE (PF) 100 MCG/2ML IJ SOLN
50.0000 ug | Freq: Once | INTRAMUSCULAR | Status: AC
  Administered 2016-11-21: 50 ug via INTRAVENOUS
  Filled 2016-11-21: qty 2

## 2016-11-21 MED ORDER — PANTOPRAZOLE SODIUM 40 MG IV SOLR
40.0000 mg | Freq: Once | INTRAVENOUS | Status: AC
  Administered 2016-11-21: 40 mg via INTRAVENOUS
  Filled 2016-11-21: qty 40

## 2016-11-21 MED ORDER — MORPHINE SULFATE (PF) 4 MG/ML IV SOLN
4.0000 mg | Freq: Once | INTRAVENOUS | Status: AC
  Administered 2016-11-21: 4 mg via INTRAVENOUS
  Filled 2016-11-21: qty 1

## 2016-11-21 MED ORDER — POTASSIUM CHLORIDE CRYS ER 20 MEQ PO TBCR
40.0000 meq | EXTENDED_RELEASE_TABLET | Freq: Once | ORAL | Status: AC
  Administered 2016-11-21: 40 meq via ORAL
  Filled 2016-11-21: qty 2

## 2016-11-21 MED ORDER — SODIUM CHLORIDE 0.9 % IV BOLUS (SEPSIS)
1000.0000 mL | Freq: Once | INTRAVENOUS | Status: AC
  Administered 2016-11-21: 1000 mL via INTRAVENOUS

## 2016-11-21 MED ORDER — ONDANSETRON HCL 4 MG/2ML IJ SOLN
4.0000 mg | Freq: Once | INTRAMUSCULAR | Status: AC | PRN
  Administered 2016-11-21: 4 mg via INTRAVENOUS

## 2016-11-21 MED ORDER — ONDANSETRON HCL 4 MG/2ML IJ SOLN
4.0000 mg | Freq: Once | INTRAMUSCULAR | Status: AC
  Administered 2016-11-21: 4 mg via INTRAVENOUS
  Filled 2016-11-21: qty 2

## 2016-11-21 MED ORDER — ONDANSETRON HCL 4 MG/2ML IJ SOLN
INTRAMUSCULAR | Status: AC
  Filled 2016-11-21: qty 2

## 2016-11-21 NOTE — ED Notes (Signed)
Pt had episode of vomiting in triage room.

## 2016-11-21 NOTE — ED Notes (Signed)
Sent add on label to main lab for ck test.

## 2016-11-21 NOTE — ED Triage Notes (Signed)
Pt from home with c/o shortness of breath, and stabbing chest pain that radiates to the back beginning this am. Pt also endorses nausea and vomiting.

## 2016-11-21 NOTE — ED Notes (Signed)
Pt reports having chest "cramps" all day. Pt is difficult and refusing to answer questions.

## 2016-11-21 NOTE — ED Provider Notes (Signed)
[facility]-EMERGENCY DEPT Provider Note   CSN: [id number] Arrival date & time: 11/21/16  1621     History   Chief Complaint Chief Complaint  Patient presents with  . Shortness of Breath  . Back Pain    HPI Justin Raymond is a 51 y.o. male.  Patient is a 51 year old male with a history of anxiety and prior kidney stones who presents with abdominal pain. He states about 9:00 this morning he started having abdominal pain which is worsened throughout the day. He states it's across his upper abdomen and radiates to his back. Also radiates up into his chest. He feels short of breath. He's had ongoing nausea and vomiting. No fevers. No urinary symptoms. No history of similar symptoms in the past. No history of EtOH use. His prior abdominal surgeries including appendectomy and hernia repair. He has not taken anything today for the pain.      Past Medical History:  Diagnosis Date  . Anxiety   . Arthritis   . 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 History of kidney stones   . PONV (postoperative nausea and vomiting)     Patient Active Problem List   Diagnosis Date Noted  . Herniated cervical disc 01/10/2014    Past Surgical History:  Procedure Laterality Date  . ANTERIOR CERVICAL DECOMP/DISCECTOMY FUSION N/A 01/10/2014   Procedure: CERVICAL SIX TO SEVEN ANTERIOR CERVICAL DECOMPRESSION/DISCECTOMY FUSION 1 LEVEL;  Surgeon: Reinaldo Meekerandy O Kritzer, MD;  Location: [facility];  Service: Neurosurgery;  Laterality: N/A;  . APPENDECTOMY  2002  . FEMUR SURGERY Right 85,97   femur plate broke and had another surgery  . FOOT SURGERY Right 97  . HERNIA REPAIR Right 2000   last one  . VASECTOMY     hospitilized afterwise for epydidimitis       Home Medications    Prior to Admission medications   Medication Sig Start Date End Date Taking? Authorizing Provider  doxycycline (VIBRAMYCIN) 100 MG capsule Take 100 mg by mouth 2 (two) times daily.    Yes [provider]  ibuprofen (ADVIL,MOTRIN) 200 MG tablet Take 400 mg by mouth every 6 (six) hours as needed for headache or mild pain.   Yes [provider]  naproxen sodium (ANAPROX) 220 MG tablet Take 440 mg by mouth daily as needed (pain).   Yes [provider]    Family History No family history on file.  Social History Social History  Substance Use Topics  . Smoking status: Current Every Day Smoker    Packs/day: 1.00    Types: Cigarettes  . Smokeless tobacco: Never Used  . Alcohol use Yes     Comment: occ     Allergies   Patient has no known allergies.   Review of Systems Review of Systems  Constitutional: Negative for chills, diaphoresis, fatigue and fever.  HENT: Negative for congestion, rhinorrhea and sneezing.   Eyes: Negative.   Respiratory: Positive for shortness of breath. Negative for cough and chest tightness.   Cardiovascular: Positive for chest pain. Negative for leg swelling.  Gastrointestinal: Positive for abdominal pain, nausea and vomiting. Negative for blood in stool and diarrhea.  Genitourinary: Negative for difficulty urinating, flank pain, frequency and hematuria.  Musculoskeletal: Negative for arthralgias and back pain.  Skin: Negative for rash.  Neurological: Negative for dizziness, speech difficulty, weakness, numbness and headaches.     Physical Exam Updated Vital Signs BP (!) 131/93   Pulse Marland Kitchen(!)  56   Resp 13   SpO2 96%   Physical Exam  Constitutional: He is oriented to person, place, and time. He appears well-developed and well-nourished. He appears distressed.  Patient appears uncomfortable and is actively vomiting  HENT:  Head: Normocephalic and atraumatic.  Eyes: Pupils are equal, round, and reactive to light. No scleral icterus.  Neck: Normal range of motion. Neck supple.  Cardiovascular: Normal rate, regular rhythm and normal heart sounds.   Pulmonary/Chest: Effort normal and breath sounds normal. No  respiratory distress. He has no wheezes. He has no rales. He exhibits no tenderness.  Abdominal: Soft. Bowel sounds are normal. There is tenderness (Moderate diffuse tenderness across the abdomen but seems to be more tender in the epigastrium). There is no rebound and no guarding.  Musculoskeletal: Normal range of motion. He exhibits no edema.  Lymphadenopathy:    He has no cervical adenopathy.  Neurological: He is alert and oriented to person, place, and time.  Skin: Skin is warm and dry. No rash noted.  Psychiatric: He has a normal mood and affect.     ED Treatments / Results  Labs (all labs ordered are listed, but only abnormal results are displayed) Labs Reviewed  CBC - Abnormal; Notable for the following:       Result Value   WBC 17.3 (*)    Hemoglobin 18.4 (*)    MCHC 36.7 (*)    All other components within normal limits  BASIC METABOLIC PANEL - Abnormal; Notable for the following:    Potassium 3.3 (*)    Chloride 99 (*)    Glucose, Bld 114 (*)    BUN 36 (*)    Creatinine, Ser 2.58 (*)    Calcium 10.9 (*)    GFR calc non Af Amer 27 (*)    GFR calc Af Amer 31 (*)    All other components within normal limits  HEPATIC FUNCTION PANEL - Abnormal; Notable for the following:    Total Protein 9.3 (*)    Albumin 5.4 (*)    Total Bilirubin 1.5 (*)    Indirect Bilirubin 1.3 (*)    All other components within normal limits  CK - Abnormal; Notable for the following:    Total CK 1,021 (*)    All other components within normal limits  LIPASE, BLOOD  URINALYSIS, ROUTINE W REFLEX MICROSCOPIC  RAPID URINE DRUG SCREEN, HOSP PERFORMED  I-STAT TROPONIN, ED    EKG  EKG Interpretation  Date/Time:  Friday November 21 2016 16:24:41 EDT Ventricular Rate:  82 PR Interval:  184 QRS Duration: 102 QT Interval:  386 QTC Calculation: 450 R Axis:   100 Text Interpretation:  Normal sinus rhythm Rightward axis Borderline ECG similar to prior EKGs Confirmed by Rolan Bucco [phone]) on  11/21/2016 4:36:41 PM       Radiology Ct Abdomen Pelvis Wo Contrast  Result Date: 11/21/2016 CLINICAL DATA:  Nausea and vomiting, onset this morning. EXAM: CT ABDOMEN AND PELVIS WITHOUT CONTRAST TECHNIQUE: Multidetector CT imaging of the abdomen and pelvis was performed following the standard protocol without IV contrast. COMPARISON:  None. FINDINGS: Lower chest: No acute abnormality. Hepatobiliary: No focal liver abnormality is seen. No gallstones, gallbladder wall thickening, or biliary dilatation. Pancreas: Unremarkable. No pancreatic ductal dilatation or surrounding inflammatory changes. Spleen: Normal in size without focal abnormality. Adrenals/Urinary Tract: Adrenal glands are unremarkable. Kidneys are normal, without renal calculi, focal lesion, or hydronephrosis. Bladder is unremarkable. Stomach/Bowel: Stomach is within normal limits. Appendectomy. Colon appears normal. No  evidence of bowel wall thickening, distention, or inflammatory changes. No bowel obstruction or perforation. Vascular/Lymphatic: The abdominal aorta is normal in caliber with moderate atherosclerotic calcification. No adenopathy in the abdomen or pelvis. Reproductive: Unremarkable Other: No focal inflammation.  No ascites. Musculoskeletal: No significant skeletal lesion. IMPRESSION: 1. No acute findings 2. Aortic atherosclerosis. Electronically Signed   By: Ellery Plunkaniel R Mitchell M.D.   On: 11/21/2016 21:11   Dg Chest 2 View  Result Date: 11/21/2016 CLINICAL DATA:  Chest pain EXAM: CHEST  2 VIEW COMPARISON:  11/05/2013 FINDINGS: The heart size and mediastinal contours are within normal limits. Both lungs are clear. The visualized skeletal structures show postsurgical changes in the cervical spine. IMPRESSION: No active cardiopulmonary disease. Electronically Signed   By: Alcide CleverMark  Lukens M.D.   On: 11/21/2016 16:59    Procedures Procedures (including critical care time)  Medications Ordered in ED Medications  sodium chloride 0.9  % bolus 1,000 mL (1,000 mLs Intravenous New Bag/Given 11/21/16 2231)  ondansetron (ZOFRAN) injection 4 mg (4 mg Intravenous Given 11/21/16 1634)  fentaNYL (SUBLIMAZE) injection 50 mcg (50 mcg Intravenous Given 11/21/16 1723)  ondansetron (ZOFRAN) injection 4 mg (4 mg Intravenous Given 11/21/16 1724)  sodium chloride 0.9 % bolus 1,000 mL (0 mLs Intravenous Stopped 11/21/16 2210)  morphine 4 MG/ML injection 4 mg (4 mg Intravenous Given 11/21/16 2222)  pantoprazole (PROTONIX) injection 40 mg (40 mg Intravenous Given 11/21/16 2221)  potassium chloride SA (K-DUR,KLOR-CON) CR tablet 40 mEq (40 mEq Oral Given 11/21/16 2217)  sterile water (preservative free) injection (10 mLs  Given 11/21/16 2221)     Initial Impression / Assessment and Plan / ED Course  I have reviewed the triage vital signs and the nursing notes.  Pertinent labs & imaging results that were available during my care of the patient were reviewed by me and considered in my medical decision making (see chart for details).     Patient is a 51 year old male who came in with vomiting. Once his symptoms calm down and was able to get a better story. He states that he started having some nausea and vomiting around 9 AM this morning it persisted and once he got to the ED he started having some blood streaks in his emesis. He developed diffuse abdominal pain which she thought was more related to soreness from the ongoing vomiting. No diarrhea. He currently is feeling better after IV fluids and antiemetics. He was also given Protonix. He's had no further episodes of vomiting or hematemesis. His abdomen has some general soreness on exam. CT scan is unremarkable. There is no acute abnormalities. No kidney stones. No evidence of pancreatitis. No obstruction. While he was in the ED started complaining of a lot of muscle cramping. He has some mild hypokalemia and his potassium was replaced. I did check a CK which is elevated at 1000. He was given 2 boluses of IV  fluids and he still has not produced any urine. We will do a bladder scan to assess for urine. He will continue IV fluids. He currently denies any chest pain or shortness of breath which he was having earlier on presentation. I feel the symptoms were more related to ongoing vomiting rather than a process in his chest. His chest x-ray was clear. There is no evidence of free air. He denies any drug use other than marijuana. He denies any alcohol use. I spoke with Dr. Toniann FailKakrakandy who will admit the pt.  Final Clinical Impressions(s) / ED Diagnoses   Final diagnoses:  Hematemesis with nausea  Acute gastritis, presence of bleeding unspecified, unspecified gastritis type  Non-traumatic rhabdomyolysis  Acute renal failure, unspecified acute renal failure type [facility])    New Prescriptions New Prescriptions   No medications on file     Rolan Bucco, MD 11/21/16 2311

## 2016-11-22 ENCOUNTER — Encounter (HOSPITAL_COMMUNITY): Payer: Self-pay | Admitting: Internal Medicine

## 2016-11-22 DIAGNOSIS — K2900 Acute gastritis without bleeding: Secondary | ICD-10-CM

## 2016-11-22 DIAGNOSIS — N179 Acute kidney failure, unspecified: Secondary | ICD-10-CM

## 2016-11-22 DIAGNOSIS — I440 Atrioventricular block, first degree: Secondary | ICD-10-CM

## 2016-11-22 DIAGNOSIS — D72829 Elevated white blood cell count, unspecified: Secondary | ICD-10-CM | POA: Diagnosis present

## 2016-11-22 DIAGNOSIS — R112 Nausea with vomiting, unspecified: Secondary | ICD-10-CM | POA: Diagnosis present

## 2016-11-22 DIAGNOSIS — R001 Bradycardia, unspecified: Secondary | ICD-10-CM

## 2016-11-22 DIAGNOSIS — K920 Hematemesis: Secondary | ICD-10-CM

## 2016-11-22 DIAGNOSIS — M6282 Rhabdomyolysis: Secondary | ICD-10-CM | POA: Diagnosis present

## 2016-11-22 LAB — BASIC METABOLIC PANEL
Anion gap: 10 (ref 5–15)
BUN: 35 mg/dL — AB (ref 6–20)
CHLORIDE: 105 mmol/L (ref 101–111)
CO2: 21 mmol/L — AB (ref 22–32)
Calcium: 9.1 mg/dL (ref 8.9–10.3)
Creatinine, Ser: 1.71 mg/dL — ABNORMAL HIGH (ref 0.61–1.24)
GFR calc Af Amer: 51 mL/min — ABNORMAL LOW (ref >60)
GFR calc non Af Amer: 44 mL/min — ABNORMAL LOW (ref >60)
GLUCOSE: 105 mg/dL — AB (ref 65–99)
POTASSIUM: 3.6 mmol/L (ref 3.5–5.1)
SODIUM: 136 mmol/L (ref 135–145)

## 2016-11-22 LAB — URINALYSIS, ROUTINE W REFLEX MICROSCOPIC
Bilirubin Urine: NEGATIVE
GLUCOSE, UA: NEGATIVE mg/dL
Ketones, ur: 5 mg/dL — AB
LEUKOCYTES UA: NEGATIVE
NITRITE: NEGATIVE
Protein, ur: 100 mg/dL — AB
Specific Gravity, Urine: 1.024 (ref 1.005–1.030)
pH: 5 (ref 5.0–8.0)

## 2016-11-22 LAB — CBC
HCT: 44.6 % (ref 39.0–52.0)
HCT: 45.5 % (ref 39.0–52.0)
Hemoglobin: 16 g/dL (ref 13.0–17.0)
Hemoglobin: 16.3 g/dL (ref 13.0–17.0)
MCH: 31.8 pg (ref 26.0–34.0)
MCH: 31.7 pg (ref 26.0–34.0)
MCHC: 35.8 g/dL (ref 30.0–36.0)
MCHC: 35.9 g/dL (ref 30.0–36.0)
MCV: 88.5 fL (ref 78.0–100.0)
MCV: 88.7 fL (ref 78.0–100.0)
PLATELETS: 268 10*3/uL (ref 150–400)
PLATELETS: 276 10*3/uL (ref 150–400)
RBC: 5.04 MIL/uL (ref 4.22–5.81)
RBC: 5.13 MIL/uL (ref 4.22–5.81)
RDW: 13.3 % (ref 11.5–15.5)
RDW: 13.2 % (ref 11.5–15.5)
WBC: 17.5 10*3/uL — AB (ref 4.0–10.5)
WBC: 17.9 10*3/uL — ABNORMAL HIGH (ref 4.0–10.5)

## 2016-11-22 LAB — RAPID URINE DRUG SCREEN, HOSP PERFORMED
AMPHETAMINES: NOT DETECTED
BARBITURATES: NOT DETECTED
Benzodiazepines: NOT DETECTED
COCAINE: NOT DETECTED
OPIATES: POSITIVE — AB
TETRAHYDROCANNABINOL: POSITIVE — AB

## 2016-11-22 LAB — TROPONIN I: Troponin I: <0.03 ng/mL (ref <0.03)

## 2016-11-22 LAB — TYPE AND SCREEN
ABO/RH(D): A POS
Antibody Screen: NEGATIVE

## 2016-11-22 LAB — CK: Total CK: 1010 U/L — ABNORMAL HIGH (ref 49–397)

## 2016-11-22 LAB — ABO/RH: ABO/RH(D): A POS

## 2016-11-22 LAB — SAVE SMEAR

## 2016-11-22 LAB — LACTIC ACID, PLASMA: LACTIC ACID, VENOUS: 1.5 mmol/L (ref 0.5–1.9)

## 2016-11-22 LAB — HIV ANTIBODY (ROUTINE TESTING W REFLEX): HIV Screen 4th Generation wRfx: NONREACTIVE

## 2016-11-22 MED ORDER — SODIUM CHLORIDE 0.9 % IV SOLN
INTRAVENOUS | Status: DC
  Administered 2016-11-22: 14:00:00 via INTRAVENOUS

## 2016-11-22 MED ORDER — PANTOPRAZOLE SODIUM 40 MG IV SOLR
40.0000 mg | Freq: Two times a day (BID) | INTRAVENOUS | Status: DC
  Administered 2016-11-22: 40 mg via INTRAVENOUS
  Filled 2016-11-22: qty 40

## 2016-11-22 MED ORDER — SODIUM CHLORIDE 0.9 % IV BOLUS (SEPSIS)
1000.0000 mL | Freq: Once | INTRAVENOUS | Status: AC
  Administered 2016-11-22: 1000 mL via INTRAVENOUS

## 2016-11-22 MED ORDER — MORPHINE SULFATE (PF) 2 MG/ML IV SOLN
1.0000 mg | INTRAVENOUS | Status: DC | PRN
  Administered 2016-11-22: 1 mg via INTRAVENOUS
  Filled 2016-11-22: qty 1

## 2016-11-22 MED ORDER — ONDANSETRON HCL 4 MG/2ML IJ SOLN
4.0000 mg | Freq: Four times a day (QID) | INTRAMUSCULAR | Status: DC | PRN
  Administered 2016-11-22: 4 mg via INTRAVENOUS
  Filled 2016-11-22: qty 2

## 2016-11-22 MED ORDER — ONDANSETRON HCL 4 MG PO TABS: 4.0000 mg | ORAL_TABLET | Freq: Four times a day (QID) | ORAL | Status: DC | PRN

## 2016-11-22 MED ORDER — ACETAMINOPHEN 650 MG RE SUPP: 650.0000 mg | Freq: Four times a day (QID) | RECTAL | Status: DC | PRN

## 2016-11-22 MED ORDER — ACETAMINOPHEN 325 MG PO TABS: 650.0000 mg | ORAL_TABLET | Freq: Four times a day (QID) | ORAL | Status: DC | PRN

## 2016-11-22 NOTE — Discharge Summary (Addendum)
Physician Discharge Summary  NESTA KIMPLE JXB:[id number] DOB: 1965/12/25 DOA: 11/21/2016  PCP: Blair Heys, MD  Admit date: 11/21/2016 Discharge date: 11/22/2016  Admitted From: Home Disposition: Home  Recommendations for Outpatient Follow-up:  1. Follow up with PCP in 1 week 2. Please obtain BMP/CBC in 3 days 3. Follow-up with cardiology for bradycardia 4. Please follow up on the following pending results: Peripheral blood smear  Home Health: None Equipment/Devices: None  Discharge Condition: Stable CODE STATUS: Full code Diet recommendation: Heart healthy   Brief/Interim Summary:  Admission HPI written by Eduard Clos, MD   Chief Complaint: Nausea vomiting.  HPI: Justin Raymond is a 51 y.o. male with no significant past medical history presents to the ER with complaints of persistent nausea vomiting since yesterday morning. Patient also has been having generalized body cramping sensation. Patient had multiple episodes of vomiting finally patient started having blood streaking in the vomitus. Patient also has been having crampy abdominal pain which was radiating to the back and to the chest. Patient denies any recent travel or sick contacts. Denies eating any abnormal food. Denies diarrhea or dysuria or any fever chills.   ED Course: Labs revealed acute renal failure. Patient had normal creatinine last year. CK was elevated at thousand. CT of the abdomen and pelvis was unremarkable. UA is pending. Patient was started on fluid bolus and admitted for further observation.    Hospital course:  Intractable nausea/vomiting Unknown etiology. Resolved on admission. CT scan abdomen was obtained and unremarkable for etiology. Patient with associated blood-streaked vomitus likely secondary to Mallory-Weiss tears. No hemoptysis while inpatient. Hemoglobin stable.  First-degree heart block Appears to be a new diagnosis. Patient consistently in the high 40s to 50s on telemetry.  EKG showed prolonged PR interval. Patient states that he has had some fatigue which may or may not be secondary to heart block. Cardiology was consulted for this and recommended outpatient follow-up. Recommended patient to stay, however patient adamant about leaving today. He will need outpatient follow-up with cardiology which they will arrange.  Leukocytosis Unknown etiology. No evidence of infection. Pathology smear review ordered and is pending on discharge. Recommend follow-up of smear review and repeat CBC to recheck white blood cell count.  Acute kidney injury Secondary to nausea and vomiting. Creatinine of 2.58 on admission and improved his 1.71 on discharge. Recommend repeat BMP to ensure continued resolution. Patient taking adequate by mouth prior to discharge.  Hyperbilirubinemia Mild. Recommend recheck compresses of metabolic panel as an outpatient. CT scan unremarkable for liver pathology.  Rhabdomyolysis Likely secondary to patient being dehydrated and working outside all day. Mild improvement with IV fluids. Patient to continue adequate hydration as an outpatient. Recommend rechecking CK as an outpatient.  Discharge Diagnoses:  Principal Problem:   AKI (acute kidney injury) (HCC) Active Problems:   Nausea & vomiting   First degree heart block   Non-traumatic rhabdomyolysis   Leukocytosis    Discharge Instructions  Discharge Instructions    Call MD for:  difficulty breathing, headache or visual disturbances    Complete by:  As directed    Call MD for:  persistant nausea and vomiting    Complete by:  As directed    Call MD for:  severe uncontrolled pain    Complete by:  As directed    Call MD for:  temperature >100.4    Complete by:  As directed    Diet - low sodium heart healthy    Complete by:  As  directed    Increase activity slowly    Complete by:  As directed      Allergies as of 11/22/2016   No Known Allergies     Medication List    STOP taking these  medications   doxycycline 100 MG capsule Commonly known as:  VIBRAMYCIN   ibuprofen 200 MG tablet Commonly known as:  ADVIL,MOTRIN   naproxen sodium 220 MG tablet Commonly known as:  ANAPROX      Follow-up Information    Blair HeysEhinger, Robert, MD. Schedule an appointment as soon as possible for a visit in 1 week(s).   Specialty:  Family Medicine Contact information: [address] [place] [address] [phone]        [facility] Follow up.   Why:  You will get a call for an appointment. If you do not hear anything, please call the office. Contact information: [address] [place] [phone]         No Known Allergies  Consultations:  Cardiology   Procedures/Studies: Ct Abdomen Pelvis Wo Contrast  Result Date: 11/21/2016 CLINICAL DATA:  Nausea and vomiting, onset this morning. EXAM: CT ABDOMEN AND PELVIS WITHOUT CONTRAST TECHNIQUE: Multidetector CT imaging of the abdomen and pelvis was performed following the standard protocol without IV contrast. COMPARISON:  None. FINDINGS: Lower chest: No acute abnormality. Hepatobiliary: No focal liver abnormality is seen. No gallstones, gallbladder wall thickening, or biliary dilatation. Pancreas: Unremarkable. No pancreatic ductal dilatation or surrounding inflammatory changes. Spleen: Normal in size without focal abnormality. Adrenals/Urinary Tract: Adrenal glands are unremarkable. Kidneys are normal, without renal calculi, focal lesion, or hydronephrosis. Bladder is unremarkable. Stomach/Bowel: Stomach is within normal limits. Appendectomy. Colon appears normal. No evidence of bowel wall thickening, distention, or inflammatory changes. No bowel obstruction or perforation. Vascular/Lymphatic: The abdominal aorta is normal in caliber with moderate atherosclerotic calcification. No adenopathy in the abdomen or pelvis. Reproductive:  Unremarkable Other: No focal inflammation.  No ascites. Musculoskeletal: No significant skeletal lesion. IMPRESSION: 1. No acute findings 2. Aortic atherosclerosis. Electronically Signed   By: Ellery Plunkaniel R Mitchell M.D.   On: 11/21/2016 21:11   Dg Chest 2 View  Result Date: 11/21/2016 CLINICAL DATA:  Chest pain EXAM: CHEST  2 VIEW COMPARISON:  11/05/2013 FINDINGS: The heart size and mediastinal contours are within normal limits. Both lungs are clear. The visualized skeletal structures show postsurgical changes in the cervical spine. IMPRESSION: No active cardiopulmonary disease. Electronically Signed   By: Alcide CleverMark  Lukens M.D.   On: 11/21/2016 16:59      Subjective: No nausea or vomiting. No muscle aches. He does have some fatigue.  Discharge Exam: Vitals:   11/22/16 0548 11/22/16 1450  BP: 110/86 138/77  Pulse: (!) 50 (!) 56  Resp: 18 16  Temp: 98.4 F (36.9 C) 98.1 F (36.7 C)   Vitals:   11/22/16 0208 11/22/16 0400 11/22/16 0548 11/22/16 1450  BP: 133/67  110/86 138/77  Pulse: (!) 58  (!) 50 (!) 56  Resp: 20  18 16   Temp: 98.7 F (37.1 C)  98.4 F (36.9 C) 98.1 F (36.7 C)  TempSrc: Oral  Oral Oral  SpO2: 98%  99% 99%  Weight:  81.1 kg (178 lb 12.8 oz)    Height:  5\' 11"  (1.803 m)      General: Pt is alert, awake, not in acute distress Cardiovascular: Slow heart rate with regular rhythm, S1/S2 +, no rubs, no gallops Respiratory: CTA  bilaterally, no wheezing, no rhonchi Abdominal: Soft, NT, ND, bowel sounds + Extremities: no edema, no cyanosis    The results of significant diagnostics from this hospitalization (including imaging, microbiology, ancillary and laboratory) are listed below for reference.     Microbiology: No results found for this or any previous visit (from the past 240 hour(s)).   Labs: BNP (last 3 results) No results for input(s): BNP in the last 8760 hours. Basic Metabolic Panel:  Recent Labs Lab 11/21/16 1827 11/22/16 0202  NA 138 136  K 3.3*  3.6  CL 99* 105  CO2 24 21*  GLUCOSE 114* 105*  BUN 36* 35*  CREATININE 2.58* 1.71*  CALCIUM 10.9* 9.1   Liver Function Tests:  Recent Labs Lab 11/21/16 1827  AST 38  ALT 32  ALKPHOS 70  BILITOT 1.5*  PROT 9.3*  ALBUMIN 5.4*    Recent Labs Lab 11/21/16 1827  LIPASE 28   No results for input(s): AMMONIA in the last 168 hours. CBC:  Recent Labs Lab 11/21/16 1630 11/22/16 0202  WBC 17.3* 17.5*  17.9*  HGB 18.4* 16.0  16.3  HCT 50.2 44.6  45.5  MCV 87.2 88.5  88.7  PLT 352 268  276   Cardiac Enzymes:  Recent Labs Lab 11/21/16 2217 11/22/16 0202 11/22/16 0721  CKTOTAL 1,021* 1,010*  --   TROPONINI  --  <0.03 <0.03   BNP: Invalid input(s): POCBNP CBG: No results for input(s): GLUCAP in the last 168 hours. D-Dimer No results for input(s): DDIMER in the last 72 hours. Hgb A1c No results for input(s): HGBA1C in the last 72 hours. Lipid Profile No results for input(s): CHOL, HDL, LDLCALC, TRIG, CHOLHDL, LDLDIRECT in the last 72 hours. Thyroid function studies No results for input(s): TSH, T4TOTAL, T3FREE, THYROIDAB in the last 72 hours.  Invalid input(s): FREET3 Anemia work up No results for input(s): VITAMINB12, FOLATE, FERRITIN, TIBC, IRON, RETICCTPCT in the last 72 hours. Urinalysis    Component Value Date/Time   COLORURINE AMBER (A) 11/21/2016 2335   APPEARANCEUR CLOUDY (A) 11/21/2016 2335   LABSPEC 1.024 11/21/2016 2335   PHURINE 5.0 11/21/2016 2335   GLUCOSEU NEGATIVE 11/21/2016 2335   HGBUR SMALL (A) 11/21/2016 2335   BILIRUBINUR NEGATIVE 11/21/2016 2335   KETONESUR 5 (A) 11/21/2016 2335   PROTEINUR 100 (A) 11/21/2016 2335   UROBILINOGEN 0.2 01/27/2014 1600   NITRITE NEGATIVE 11/21/2016 2335   LEUKOCYTESUR NEGATIVE 11/21/2016 2335    SIGNED:   Jacquelin Hawkingalph Kenyon Eichelberger, MD [facility] 11/22/2016, 4:45 PM Pager [phone]  If 7PM-7AM, please contact night-coverage [email or web address] Password [email or web address]

## 2016-11-22 NOTE — Progress Notes (Signed)
[facility] & he claimed pt.was pended for 28mins.but was only pended for 8 mins.

## 2016-11-22 NOTE — H&P (Signed)
History and Physical    Justin HoveJohn O Ruvalcaba ZOX:[id number] DOB: 02/11/1966 DOA: 11/21/2016  PCP: Blair HeysEhinger, Robert, MD  Patient coming from: Home.  Chief Complaint: Nausea vomiting.  HPI: Justin Raymond is a 51 y.o. male with no significant past medical history presents to the ER with complaints of persistent nausea vomiting since yesterday morning. Patient also has been having generalized body cramping sensation. Patient had multiple episodes of vomiting finally patient started having blood streaking in the vomitus. Patient also has been having crampy abdominal pain which was radiating to the back and to the chest. Patient denies any recent travel or sick contacts. Denies eating any abnormal food. Denies diarrhea or dysuria or any fever chills.   ED Course: Labs revealed acute renal failure. Patient had normal creatinine last year. CK was elevated at thousand. CT of the abdomen and pelvis was unremarkable. UA is pending. Patient was started on fluid bolus and admitted for further observation.  Review of Systems: As per HPI, rest all negative.   Past Medical History:  Diagnosis Date  . Anxiety   . Arthritis   . 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 History of kidney stones   . PONV (postoperative nausea and vomiting)     Past Surgical History:  Procedure Laterality Date  . ANTERIOR CERVICAL DECOMP/DISCECTOMY FUSION N/A 01/10/2014   Procedure: CERVICAL SIX TO SEVEN ANTERIOR CERVICAL DECOMPRESSION/DISCECTOMY FUSION 1 LEVEL;  Surgeon: Reinaldo Meekerandy O Kritzer, MD;  Location: [facility];  Service: Neurosurgery;  Laterality: N/A;  . APPENDECTOMY  2002  . FEMUR SURGERY Right 85,97   femur plate broke and had another surgery  . FOOT SURGERY Right 97  . HERNIA REPAIR Right 2000   last one  . VASECTOMY     hospitilized afterwise for epydidimitis     reports that he has been smoking Cigarettes.  He has been smoking about 1.00 pack per day. He has  never used smokeless tobacco. He reports that he drinks alcohol. He reports that he uses drugs, including Marijuana.  No Known Allergies  Family History  Problem Relation Age of Onset  . Hypertension Other     Prior to Admission medications   Medication Sig Start Date End Date Taking? Authorizing Provider  doxycycline (VIBRAMYCIN) 100 MG capsule Take 100 mg by mouth 2 (two) times daily.   Yes [provider]  ibuprofen (ADVIL,MOTRIN) 200 MG tablet Take 400 mg by mouth every 6 (six) hours as needed for headache or mild pain.   Yes [provider]  naproxen sodium (ANAPROX) 220 MG tablet Take 440 mg by mouth daily as needed (pain).   Yes [provider]    Physical Exam: Vitals:   11/22/16 0000 11/22/16 0015 11/22/16 0035 11/22/16 0100  BP: 112/73 110/64 115/71 (!) 99/57  Pulse: (!) 55 (!) 50 (!) 56 (!) 56  Resp: 17 11 15 15   SpO2: 97% 97% 95% 95%      Constitutional: Moderately built and nourished. Vitals:   11/22/16 0000 11/22/16 0015 11/22/16 0035 11/22/16 0100  BP: 112/73 110/64 115/71 (!) 99/57  Pulse: (!) 55 (!) 50 (!) 56 (!) 56  Resp: 17 11 15 15   SpO2: 97% 97% 95% 95%   Eyes: Anicteric no pallor. ENMT: No discharge from the ears eyes nose and mouth. Neck: No mass felt. No neck rigidity. No JVD appreciated. Respiratory: No rhonchi or crepitations. Cardiovascular: S1-S2 heard no murmurs appreciated. Abdomen: Soft nontender bowel  sounds present. No guarding or rigidity. Musculoskeletal: No edema. No joint effusion. Skin: No rash. Skin appears warm. Neurologic: Alert awake oriented to time place and person. Moves all extremities 5 x 5. Psychiatric: Appears normal. Normal affect.   Labs on Admission: I have personally reviewed following labs and imaging studies  CBC:  Recent Labs Lab 11/21/16 1630  WBC 17.3*  HGB 18.4*  HCT 50.2  MCV 87.2  PLT 352   Basic Metabolic Panel:  Recent Labs Lab 11/21/16 1827  NA 138  K 3.3*  CL  99*  CO2 24  GLUCOSE 114*  BUN 36*  CREATININE 2.58*  CALCIUM 10.9*   GFR: CrCl cannot be calculated (Unknown ideal weight.). Liver Function Tests:  Recent Labs Lab 11/21/16 1827  AST 38  ALT 32  ALKPHOS 70  BILITOT 1.5*  PROT 9.3*  ALBUMIN 5.4*    Recent Labs Lab 11/21/16 1827  LIPASE 28   No results for input(s): AMMONIA in the last 168 hours. Coagulation Profile: No results for input(s): INR, PROTIME in the last 168 hours. Cardiac Enzymes:  Recent Labs Lab 11/21/16 2217  CKTOTAL 1,021*   BNP (last 3 results) No results for input(s): PROBNP in the last 8760 hours. HbA1C: No results for input(s): HGBA1C in the last 72 hours. CBG: No results for input(s): GLUCAP in the last 168 hours. Lipid Profile: No results for input(s): CHOL, HDL, LDLCALC, TRIG, CHOLHDL, LDLDIRECT in the last 72 hours. Thyroid Function Tests: No results for input(s): TSH, T4TOTAL, FREET4, T3FREE, THYROIDAB in the last 72 hours. Anemia Panel: No results for input(s): VITAMINB12, FOLATE, FERRITIN, TIBC, IRON, RETICCTPCT in the last 72 hours. Urine analysis:    Component Value Date/Time   COLORURINE AMBER (A) 11/21/2016 2335   APPEARANCEUR CLOUDY (A) 11/21/2016 2335   LABSPEC 1.024 11/21/2016 2335   PHURINE 5.0 11/21/2016 2335   GLUCOSEU NEGATIVE 11/21/2016 2335   HGBUR SMALL (A) 11/21/2016 2335   BILIRUBINUR NEGATIVE 11/21/2016 2335   KETONESUR 5 (A) 11/21/2016 2335   PROTEINUR 100 (A) 11/21/2016 2335   UROBILINOGEN 0.2 01/27/2014 1600   NITRITE NEGATIVE 11/21/2016 2335   LEUKOCYTESUR NEGATIVE 11/21/2016 2335   Sepsis Labs: @LABRCNTIP (procalcitonin:4,lacticidven:4) )No results found for this or any previous visit (from the past 240 hour(s)).   Radiological Exams on Admission: Ct Abdomen Pelvis Wo Contrast  Result Date: 11/21/2016 CLINICAL DATA:  Nausea and vomiting, onset this morning. EXAM: CT ABDOMEN AND PELVIS WITHOUT CONTRAST TECHNIQUE: Multidetector CT imaging of the  abdomen and pelvis was performed following the standard protocol without IV contrast. COMPARISON:  None. FINDINGS: Lower chest: No acute abnormality. Hepatobiliary: No focal liver abnormality is seen. No gallstones, gallbladder wall thickening, or biliary dilatation. Pancreas: Unremarkable. No pancreatic ductal dilatation or surrounding inflammatory changes. Spleen: Normal in size without focal abnormality. Adrenals/Urinary Tract: Adrenal glands are unremarkable. Kidneys are normal, without renal calculi, focal lesion, or hydronephrosis. Bladder is unremarkable. Stomach/Bowel: Stomach is within normal limits. Appendectomy. Colon appears normal. No evidence of bowel wall thickening, distention, or inflammatory changes. No bowel obstruction or perforation. Vascular/Lymphatic: The abdominal aorta is normal in caliber with moderate atherosclerotic calcification. No adenopathy in the abdomen or pelvis. Reproductive: Unremarkable Other: No focal inflammation.  No ascites. Musculoskeletal: No significant skeletal lesion. IMPRESSION: 1. No acute findings 2. Aortic atherosclerosis. Electronically Signed   By: Ellery Plunk M.D.   On: 11/21/2016 21:11   Dg Chest 2 View  Result Date: 11/21/2016 CLINICAL DATA:  Chest pain EXAM: CHEST  2 VIEW COMPARISON:  11/05/2013 FINDINGS: The heart size and mediastinal contours are within normal limits. Both lungs are clear. The visualized skeletal structures show postsurgical changes in the cervical spine. IMPRESSION: No active cardiopulmonary disease. Electronically Signed   By: Alcide CleverMark  Lukens M.D.   On: 11/21/2016 16:59    EKG: Independently reviewed. Normal sinus rhythm.  Assessment/Plan Principal Problem:   Nausea & vomiting Active Problems:   ARF (acute renal failure) (HCC)    1. Intractable nausea vomiting - cause not clear. CT abdomen and pelvis unremarkable. Urine drug screen is pending. Continue with antiemetics and fluids. Since patient has had blood streaking in  the vomitus I have placed patient on Protonix. Follow CBC. May consider gastroenterology consult. Patient does take Aleve for back pain, which will be held. 2. Acute renal failure probably from nausea and vomiting - UA is pending. Continue to hydrate and follow metabolic panel intake output. Hold NSAIDs which could also be contributing to the renal failure. 3. Mild rhabdomyolysis - hydrate and recheck CK levels.   DVT prophylaxis: SCDs. Code Status: Full code.  Family Communication: Wife. Disposition Plan: Home.  Consults called: None.  Admission status: Observation.    Eduard ClosKAKRAKANDY,Shavonne Ambroise N. MD [facility] Pager [phone].  If 7PM-7AM, please contact night-coverage [email or web address] Password [email or web address]  11/22/2016, 1:33 AM

## 2016-11-22 NOTE — Discharge Instructions (Addendum)
Justin Raymond  You were in the hospital because of dehydration that lead to kidney injury. You also have signs of rhabdomyolysis likely from significant dehydration. Please keep well hydrated when you ar discharged. Incidentally, you have a slow heart rate. Cardiology would like you to follow-up as an outpatient. Please see your primary care physician in this upcoming week to have repeat lab work. This is important.       Rhabdomyolysis Rhabdomyolysis is a condition that happens when muscle cells break down and release substances into the blood that can damage the kidneys. This condition happens because of damage to the muscles that move bones (skeletal muscle). When the skeletal muscles are damaged, substances inside the muscle cells go into the blood. One of these substances is a protein called myoglobin. Large amounts of myoglobin can cause kidney damage or kidney failure. Other substances that are released by muscle cells may upset the balance of the minerals (electrolytes) in your blood. This imbalance causes your blood to have too much acid (acidosis). What are the causes? This condition is caused by muscle damage. Muscle damage often happens because of:  Using your muscles too much.  An injury that crushes or squeezes a muscle too tightly.  Using illegal drugs, mainly cocaine.  Alcohol abuse.  Other possible causes include:  Prescription medicines, such as those that: ? Lower cholesterol (statins). ? Treat ADHD (attention deficit hyperactivity disorder) or help with weight loss (amphetamines). ? Treat pain (opiates).  Infections.  Muscle diseases that are passed down from parent to child (inherited).  High fever.  Heatstroke.  Not having enough fluids in your body (dehydration).  Seizures.  Surgery.  What increases the risk? This condition is more likely to develop in people who:  Have a family history of muscle disease.  Take part in extreme sports, such as  running in marathons.  Have diabetes.  Are older.  Abuse drugs or alcohol.  What are the signs or symptoms? Symptoms of this condition vary. Some people have very few symptoms, and other people have many symptoms. The most common symptoms include:  Muscle pain and swelling.  Weak muscles.  Dark urine.  Feeling weak and tired.  Other symptoms include:  Nausea and vomiting.  Fever.  Pain in the abdomen.  Pain in the joints.  Symptoms of complications from this condition include:  Heart rhythm that is not normal (arrhythmia).  Seizures.  Not urinating enough because of kidney failure.  Very low blood pressure (shock). Signs of shock include dizziness, blurry vision, and clammy skin.  Bleeding that is hard to stop or control.  How is this diagnosed? This condition is diagnosed based on your medical history, your symptoms, and a physical exam. Tests may also be done, including:  Blood tests.  Urine tests to check for myoglobin.  You may also have other tests to check for causes of muscle damage and to check for complications. How is this treated? Treatment for this condition helps to:  Make sure you have enough fluids in your body.  Lower the acid levels in your blood to reverse acidosis.  Protect your kidneys.  Treatment may include:  Fluids and medicines given through an IV tube that is inserted into one of your veins.  Medicines to lower acidosis or to bring back the balance of the minerals in your body.  Hemodialysis. This treatment uses an artificial kidney machine to filter your blood while you recover. You may have this if other treatments are not  helping.  Follow these instructions at home:  Take over-the-counter and prescription medicines only as told by your health care provider.  Rest at home until your health care provider says that you can return to your normal activities.  Drink enough fluid to keep your urine clear or pale  yellow.  Do not do activities that take a lot of effort (are strenuous). Ask your health care provider what level of exercise is safe for you.  Do not abuse drugs or alcohol. If you are having problems with drug or alcohol use, ask your health care provider for help.  Keep all follow-up visits as told by your health care provider. This is important. Contact a health care provider if:  You start having symptoms of this condition after treatment. Get help right away if:  You have a seizure.  You bleed easily or cannot control bleeding.  You cannot urinate.  You have chest pain.  You have trouble breathing. This information is not intended to replace advice given to you by your health care provider. Make sure you discuss any questions you have with your health care provider. Document Released: 03/27/2004 Document Revised: 01/25/2016 Document Reviewed: 01/25/2016 Elsevier Interactive Patient Education  [employer]2018 [employer]

## 2016-11-22 NOTE — Consult Note (Signed)
Primary Physician: Primary Cardiologist:  New    Consultd by Dr Caleb Popp for bradycardia  HPI: Pt is a 51 yo with no known cardiac problems   He was admitted yesterday with N/V and cramping   Labs signfi for CK over 1000  Cr increased   WBC 17K    He was started on IV fluids    Troponin was negatve  Pt has been maintained on tele  HR   50s  The patient says he has had increased fatigue for awhile  Works outside Restaurant manager, [employer] food.  Has gotten weak with this  SOme dizziness  No syncope  Goes home and tries to cool down  Denies CP  Gets cramping all over   Denies syncope          Past Medical History:  Diagnosis Date  . Anxiety   . Arthritis   . 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 History of kidney stones   . PONV (postoperative nausea and vomiting)     Medications Prior to Admission  Medication Sig Dispense Refill  . doxycycline (VIBRAMYCIN) 100 MG capsule Take 100 mg by mouth 2 (two) times daily.    Marland Kitchen ibuprofen (ADVIL,MOTRIN) 200 MG tablet Take 400 mg by mouth every 6 (six) hours as needed for headache or mild pain.    . naproxen sodium (ANAPROX) 220 MG tablet Take 440 mg by mouth daily as needed (pain).       . pantoprazole (PROTONIX) IV  40 mg Intravenous Q12H    Infusions: . sodium chloride 125 mL/hr at 11/22/16 1354    No Known Allergies  Social History   Social History  . Marital status: Married    Spouse name: N/A  . Number of children: N/A  . Years of education: N/A   Occupational History  . Not on file.   Social History Main Topics  . Smoking status: Current Every Day Smoker    Packs/day: 1.00    Types: Cigarettes  . Smokeless tobacco: Never Used  . Alcohol use Yes     Comment: occ  . Drug use: Yes    Types: Marijuana     Comment: few years  . Sexual activity: Not on file   Other Topics Concern  . Not on file   Social History Narrative  . No narrative on file    Family  History  Problem Relation Age of Onset  . Hypertension Other     REVIEW OF SYSTEMS:  All systems reviewed  Negative to the above problem except as noted above.    PHYSICAL EXAM: Vitals:   11/22/16 0548 11/22/16 1450  BP: 110/86 138/77  Pulse: (!) 50 (!) 56  Resp: 18 16  Temp: 98.4 F (36.9 C) 98.1 F (36.7 C)     Intake/Output Summary (Last 24 hours) at 11/22/16 1656 Last data filed at 11/22/16 1652  Gross per 24 hour  Intake          1850.83 ml  Output                0 ml  Net          1850.83 ml    General:  Well appearing. No respiratory difficulty HEENT: normal Neck: supple. no JVD. Carotids 2+ bilat; no bruits. No lymphadenopathy or thryomegaly appreciated. Cor: PMI nondisplaced. Regular rate & rhythm. No rubs, gallops or murmurs. Lungs: clear Abdomen: soft, nontender, nondistended. No hepatosplenomegaly. No  bruits or masses. Good bowel sounds. Extremities: no cyanosis, clubbing, rash, edema Neuro: alert & oriented x 3, cranial nerves grossly intact. moves all 4 extremities w/o difficulty. Affect pleasant.  ECG:  SB 52 bpm  NOnspecific ST changes    Results for orders placed or performed during the hospital encounter of 11/21/16 (from the past 24 hour(s))  Basic metabolic panel     Status: Abnormal   Collection Time: 11/21/16  6:27 PM  Result Value Ref Range   Sodium 138 135 - 145 mmol/L   Potassium 3.3 (L) 3.5 - 5.1 mmol/L   Chloride 99 (L) 101 - 111 mmol/L   CO2 24 22 - 32 mmol/L   Glucose, Bld 114 (H) 65 - 99 mg/dL   BUN 36 (H) 6 - 20 mg/dL   Creatinine, Ser 8.292.58 (H) 0.61 - 1.24 mg/dL   Calcium 56.210.9 (H) 8.9 - 10.3 mg/dL   GFR calc non Af Amer 27 (L) >60 mL/min   GFR calc Af Amer 31 (L) >60 mL/min   Anion gap 15 5 - 15  Hepatic function panel     Status: Abnormal   Collection Time: 11/21/16  6:27 PM  Result Value Ref Range   Total Protein 9.3 (H) 6.5 - 8.1 g/dL   Albumin 5.4 (H) 3.5 - 5.0 g/dL   AST 38 15 - 41 U/L   ALT 32 17 - 63 U/L   Alkaline  Phosphatase 70 38 - 126 U/L   Total Bilirubin 1.5 (H) 0.3 - 1.2 mg/dL   Bilirubin, Direct 0.2 0.1 - 0.5 mg/dL   Indirect Bilirubin 1.3 (H) 0.3 - 0.9 mg/dL  Lipase, blood     Status: None   Collection Time: 11/21/16  6:27 PM  Result Value Ref Range   Lipase 28 11 - 51 U/L  CK     Status: Abnormal   Collection Time: 11/21/16 10:17 PM  Result Value Ref Range   Total CK 1,021 (H) 49 - 397 U/L  Urinalysis, Routine w reflex microscopic     Status: Abnormal   Collection Time: 11/21/16 11:35 PM  Result Value Ref Range   Color, Urine AMBER (A) YELLOW   APPearance CLOUDY (A) CLEAR   Specific Gravity, Urine 1.024 1.005 - 1.030   pH 5.0 5.0 - 8.0   Glucose, UA NEGATIVE NEGATIVE mg/dL   Hgb urine dipstick SMALL (A) NEGATIVE   Bilirubin Urine NEGATIVE NEGATIVE   Ketones, ur 5 (A) NEGATIVE mg/dL   Protein, ur 130100 (A) NEGATIVE mg/dL   Nitrite NEGATIVE NEGATIVE   Leukocytes, UA NEGATIVE NEGATIVE   RBC / HPF 6-30 0 - 5 RBC/hpf   WBC, UA 6-30 0 - 5 WBC/hpf   Bacteria, UA MANY (A) NONE SEEN   Squamous Epithelial / LPF 0-5 (A) NONE SEEN   Mucous PRESENT    Hyaline Casts, UA PRESENT   Urine rapid drug screen (hosp performed)     Status: Abnormal   Collection Time: 11/21/16 11:35 PM  Result Value Ref Range   Opiates POSITIVE (A) NONE DETECTED   Cocaine NONE DETECTED NONE DETECTED   Benzodiazepines NONE DETECTED NONE DETECTED   Amphetamines NONE DETECTED NONE DETECTED   Tetrahydrocannabinol POSITIVE (A) NONE DETECTED   Barbiturates NONE DETECTED NONE DETECTED  CBC     Status: Abnormal   Collection Time: 11/22/16  2:02 AM  Result Value Ref Range   WBC 17.9 (H) 4.0 - 10.5 K/uL   RBC 5.13 4.22 - 5.81 MIL/uL   Hemoglobin 16.3  13.0 - 17.0 g/dL   HCT 62.145.5 30.839.0 - 65.752.0 %   MCV 88.7 78.0 - 100.0 fL   MCH 31.8 26.0 - 34.0 pg   MCHC 35.8 30.0 - 36.0 g/dL   RDW 84.613.2 96.211.5 - 95.215.5 %   Platelets 276 150 - 400 K/uL  CBC     Status: Abnormal   Collection Time: 11/22/16  2:02 AM  Result Value Ref Range     WBC 17.5 (H) 4.0 - 10.5 K/uL   RBC 5.04 4.22 - 5.81 MIL/uL   Hemoglobin 16.0 13.0 - 17.0 g/dL   HCT 84.144.6 32.439.0 - 40.152.0 %   MCV 88.5 78.0 - 100.0 fL   MCH 31.7 26.0 - 34.0 pg   MCHC 35.9 30.0 - 36.0 g/dL   RDW 02.713.3 25.311.5 - 66.415.5 %   Platelets 268 150 - 400 K/uL  Lactic acid, plasma     Status: None   Collection Time: 11/22/16  2:02 AM  Result Value Ref Range   Lactic Acid, Venous 1.5 0.5 - 1.9 mmol/L  CK     Status: Abnormal   Collection Time: 11/22/16  2:02 AM  Result Value Ref Range   Total CK 1,010 (H) 49 - 397 U/L  HIV antibody (Routine Testing)     Status: None   Collection Time: 11/22/16  2:02 AM  Result Value Ref Range   HIV Screen 4th Generation wRfx Non Reactive Non Reactive  Basic metabolic panel     Status: Abnormal   Collection Time: 11/22/16  2:02 AM  Result Value Ref Range   Sodium 136 135 - 145 mmol/L   Potassium 3.6 3.5 - 5.1 mmol/L   Chloride 105 101 - 111 mmol/L   CO2 21 (L) 22 - 32 mmol/L   Glucose, Bld 105 (H) 65 - 99 mg/dL   BUN 35 (H) 6 - 20 mg/dL   Creatinine, Ser 4.031.71 (H) 0.61 - 1.24 mg/dL   Calcium 9.1 8.9 - 47.410.3 mg/dL   GFR calc non Af Amer 44 (L) >60 mL/min   GFR calc Af Amer 51 (L) >60 mL/min   Anion gap 10 5 - 15  Troponin I (q 6hr x 3)     Status: None   Collection Time: 11/22/16  2:02 AM  Result Value Ref Range   Troponin I <0.03 <0.03 ng/mL  Type and screen [facility]     Status: None   Collection Time: 11/22/16  2:06 AM  Result Value Ref Range   ABO/RH(D) A POS    Antibody Screen NEG    Sample Expiration 11/25/2016   ABO/Rh     Status: None   Collection Time: 11/22/16  2:06 AM  Result Value Ref Range   ABO/RH(D) A POS   Troponin I (q 6hr x 3)     Status: None   Collection Time: 11/22/16  7:21 AM  Result Value Ref Range   Troponin I <0.03 <0.03 ng/mL  Save smear     Status: None   Collection Time: 11/22/16 12:12 PM  Result Value Ref Range   Smear Review SMEAR STAINED AND AVAILABLE FOR REVIEW    Ct Abdomen Pelvis  Wo Contrast  Result Date: 11/21/2016 CLINICAL DATA:  Nausea and vomiting, onset this morning. EXAM: CT ABDOMEN AND PELVIS WITHOUT CONTRAST TECHNIQUE: Multidetector CT imaging of the abdomen and pelvis was performed following the standard protocol without IV contrast. COMPARISON:  None. FINDINGS: Lower chest: No acute abnormality. Hepatobiliary: No focal liver abnormality is  seen. No gallstones, gallbladder wall thickening, or biliary dilatation. Pancreas: Unremarkable. No pancreatic ductal dilatation or surrounding inflammatory changes. Spleen: Normal in size without focal abnormality. Adrenals/Urinary Tract: Adrenal glands are unremarkable. Kidneys are normal, without renal calculi, focal lesion, or hydronephrosis. Bladder is unremarkable. Stomach/Bowel: Stomach is within normal limits. Appendectomy. Colon appears normal. No evidence of bowel wall thickening, distention, or inflammatory changes. No bowel obstruction or perforation. Vascular/Lymphatic: The abdominal aorta is normal in caliber with moderate atherosclerotic calcification. No adenopathy in the abdomen or pelvis. Reproductive: Unremarkable Other: No focal inflammation.  No ascites. Musculoskeletal: No significant skeletal lesion. IMPRESSION: 1. No acute findings 2. Aortic atherosclerosis. Electronically Signed   By: Ellery Plunk M.D.   On: 11/21/2016 21:11   Dg Chest 2 View  Result Date: 11/21/2016 CLINICAL DATA:  Chest pain EXAM: CHEST  2 VIEW COMPARISON:  11/05/2013 FINDINGS: The heart size and mediastinal contours are within normal limits. Both lungs are clear. The visualized skeletal structures show postsurgical changes in the cervical spine. IMPRESSION: No active cardiopulmonary disease. Electronically Signed   By: Alcide Clever M.D.   On: 11/21/2016 16:59     ASSESSMENT:  Pt is a 51 yo who is a difficult historian  He presented with mild case of rhabdpmyolysis  Poss due to heat   Undergoing hydration Found to be bradycardic  I am  not convinced that he is symptomatic from this , that it is cuse for fatigue  BP is OK  The pt has atherosclerosis in abdominal aorta.  Concerning is that fatigue may be anginal equiv    Pt OK to d/c from a cardiac standpoint    Will make sure he has f/u in clinic for risk factor analysis.   He should have cholesterol treated  With CK elevation  Dedra Skeens about statins  Again, can address as outpt  Agree with IV hydration  Follow renal function  WBC elevated at 17 K  No obvious cause  Will need to be followed.  Mr.

## 2016-11-22 NOTE — Plan of Care (Signed)
Problem: Safety: Goal: Ability to remain free from injury will improve Outcome: Progressing Pt will be free from falls and injuries during this hospitalization.  Problem: Bowel/Gastric: Goal: Will not experience complications related to bowel motility Outcome: Progressing Pt will be free from nausea/vommiting prior to discharge.

## 2016-11-23 LAB — URINE CULTURE: CULTURE: NO GROWTH

## 2018-04-07 ENCOUNTER — Encounter (HOSPITAL_COMMUNITY): Payer: Self-pay

## 2018-04-07 ENCOUNTER — Emergency Department (HOSPITAL_COMMUNITY)
Admission: EM | Admit: 2018-04-07 | Discharge: 2018-04-07 | Disposition: A | Discharge status: Home / Self Care | Payer: Self-pay | Attending: Emergency Medicine | Admitting: Emergency Medicine

## 2018-04-07 ENCOUNTER — Other Ambulatory Visit: Payer: Self-pay

## 2018-04-07 DIAGNOSIS — F17210 Nicotine dependence, cigarettes, uncomplicated: Secondary | ICD-10-CM | POA: Insufficient documentation

## 2018-04-07 DIAGNOSIS — H1031 Unspecified acute conjunctivitis, right eye: Secondary | ICD-10-CM | POA: Insufficient documentation

## 2018-04-07 MED ORDER — GENTAMICIN SULFATE 0.3 % OP SOLN: 2.0000 [drp] | Freq: Four times a day (QID) | OPHTHALMIC | 0 refills | Status: AC

## 2018-04-07 MED ORDER — FLUORESCEIN SODIUM 1 MG OP STRP
1.0000 | ORAL_STRIP | Freq: Once | OPHTHALMIC | Status: AC
  Administered 2018-04-07: 1 via OPHTHALMIC
  Filled 2018-04-07: qty 1

## 2018-04-07 MED ORDER — TETRACAINE HCL 0.5 % OP SOLN
2.0000 [drp] | Freq: Once | OPHTHALMIC | Status: AC
  Administered 2018-04-07: 2 [drp] via OPHTHALMIC
  Filled 2018-04-07: qty 4

## 2018-04-07 NOTE — Discharge Instructions (Addendum)
Gentamicin drops as prescribed.  Return to the emergency department or your primary doctor for a recheck if no improvement in the next 2 to 3 days, sooner if symptoms significantly worsen or change.

## 2018-04-07 NOTE — ED Triage Notes (Addendum)
Pt states that last night, his lower right eyelid started swelling and got progressively worse. Pt states that there was "blood" in the outer corner. Pt states that he works [employer], but was not working with anything yesterday that could have gotten in his eye. Pt states that it feels like there is pressure on his right eye. Pt states that his vision is WNL. Pt states that he has tried eyedrops and flushing his eyes without relief.

## 2018-04-07 NOTE — ED Provider Notes (Signed)
[facility] Provider Note   CSN: [id number] Arrival date & time: 04/07/18  0849     History   Chief Complaint Chief Complaint  Patient presents with  . Eye Pain    HPI Justin Raymond is a 52 y.o. male.  Patient is a 52 year old male with history of renal calculi presenting with complaints of right eye pain.  This started 2 days ago and is worsening.  He denies any specific injury, trauma, or foreign body.  He does report some drainage upon waking in the morning.  He denies any visual disturbances.  The history is provided by the patient.  Eye Pain  This is a new problem. The current episode started 2 days ago. The problem occurs constantly. The problem has been rapidly worsening. Nothing aggravates the symptoms. Nothing relieves the symptoms. He has tried nothing for the symptoms.    Past Medical History:  Diagnosis Date  . Anxiety   . Arthritis   . 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 History of kidney stones   . PONV (postoperative nausea and vomiting)     Patient Active Problem List   Diagnosis Date Noted  . AKI (acute kidney injury) (HCC) 11/22/2016  . Nausea & vomiting 11/22/2016  . First degree heart block 11/22/2016  . Non-traumatic rhabdomyolysis 11/22/2016  . Leukocytosis 11/22/2016  . Herniated cervical disc 01/10/2014    Past Surgical History:  Procedure Laterality Date  . ANTERIOR CERVICAL DECOMP/DISCECTOMY FUSION N/A 01/10/2014   Procedure: CERVICAL SIX TO SEVEN ANTERIOR CERVICAL DECOMPRESSION/DISCECTOMY FUSION 1 LEVEL;  Surgeon: Reinaldo Meekerandy O Kritzer, MD;  Location: [facility];  Service: Neurosurgery;  Laterality: N/A;  . APPENDECTOMY  2002  . FEMUR SURGERY Right 85,97   femur plate broke and had another surgery  . FOOT SURGERY Right 97  . HERNIA REPAIR Right 2000   last one  . VASECTOMY     hospitilized afterwise for epydidimitis        Home Medications      Prior to Admission medications   Not on File    Family History Family History  Problem Relation Age of Onset  . Hypertension Other     Social History Social History   Tobacco Use  . Smoking status: Current Every Day Smoker    Packs/day: 1.00    Types: Cigarettes  . Smokeless tobacco: Never Used  Substance Use Topics  . Alcohol use: Yes    Comment: occ  . Drug use: Yes    Types: Marijuana    Comment: few years     Allergies   Patient has no known allergies.   Review of Systems Review of Systems  Eyes: Positive for pain.  All other systems reviewed and are negative.    Physical Exam Updated Vital Signs BP (!) 147/92 (BP Location: Right Arm)   Pulse 84   Temp 98 F (36.7 C) (Oral)   Resp 16   Ht 5\' 11"  (1.803 m)   Wt 80.7 kg   SpO2 100%   BMI 24.83 kg/m   Physical Exam  Constitutional: He is oriented to person, place, and time. He appears well-developed and well-nourished.  HENT:  Head: Normocephalic and atraumatic.  Eyes: Pupils are equal, round, and reactive to light. EOM are normal.  There is conjunctival injection, right greater than left.  There is some yellowish drainage noted in the eyelashes of the right eye.  The cornea appears clear  to both inspection and fluorescein staining.  Pupil is reactive and anterior chamber is clear.  Pulmonary/Chest: Effort normal.  Neurological: He is alert and oriented to person, place, and time.  Skin: He is not diaphoretic.  Nursing note and vitals reviewed.    ED Treatments / Results  Labs (all labs ordered are listed, but only abnormal results are displayed) Labs Reviewed - No data to display  EKG None  Radiology No results found.  Procedures Procedures (including critical care time)  Medications Ordered in ED Medications  fluorescein ophthalmic strip 1 strip (1 strip Right Eye Given 04/07/18 0911)  tetracaine (PONTOCAINE) 0.5 % ophthalmic solution 2 drop (2 drops Right Eye Given 04/07/18  0911)     Initial Impression / Assessment and Plan / ED Course  I have reviewed the triage vital signs and the nursing notes.  Pertinent labs & imaging results that were available during my care of the patient were reviewed by me and considered in my medical decision making (see chart for details).  This appears to be conjunctivitis.  This will be treated with gentamicin drops and follow-up as needed.  Final Clinical Impressions(s) / ED Diagnoses   Final diagnoses:  None    ED Discharge Orders    None       Geoffery Lyons, MD 04/07/18 [phone]

## 2018-11-02 IMAGING — CR DG CHEST 2V
3 series · 3 of 3 positions shown · non-contrast
Comparison: 11/05/2013

CLINICAL DATA: Chest pain

EXAM:
CHEST  2 VIEW

[chest lat (1 of 2)]
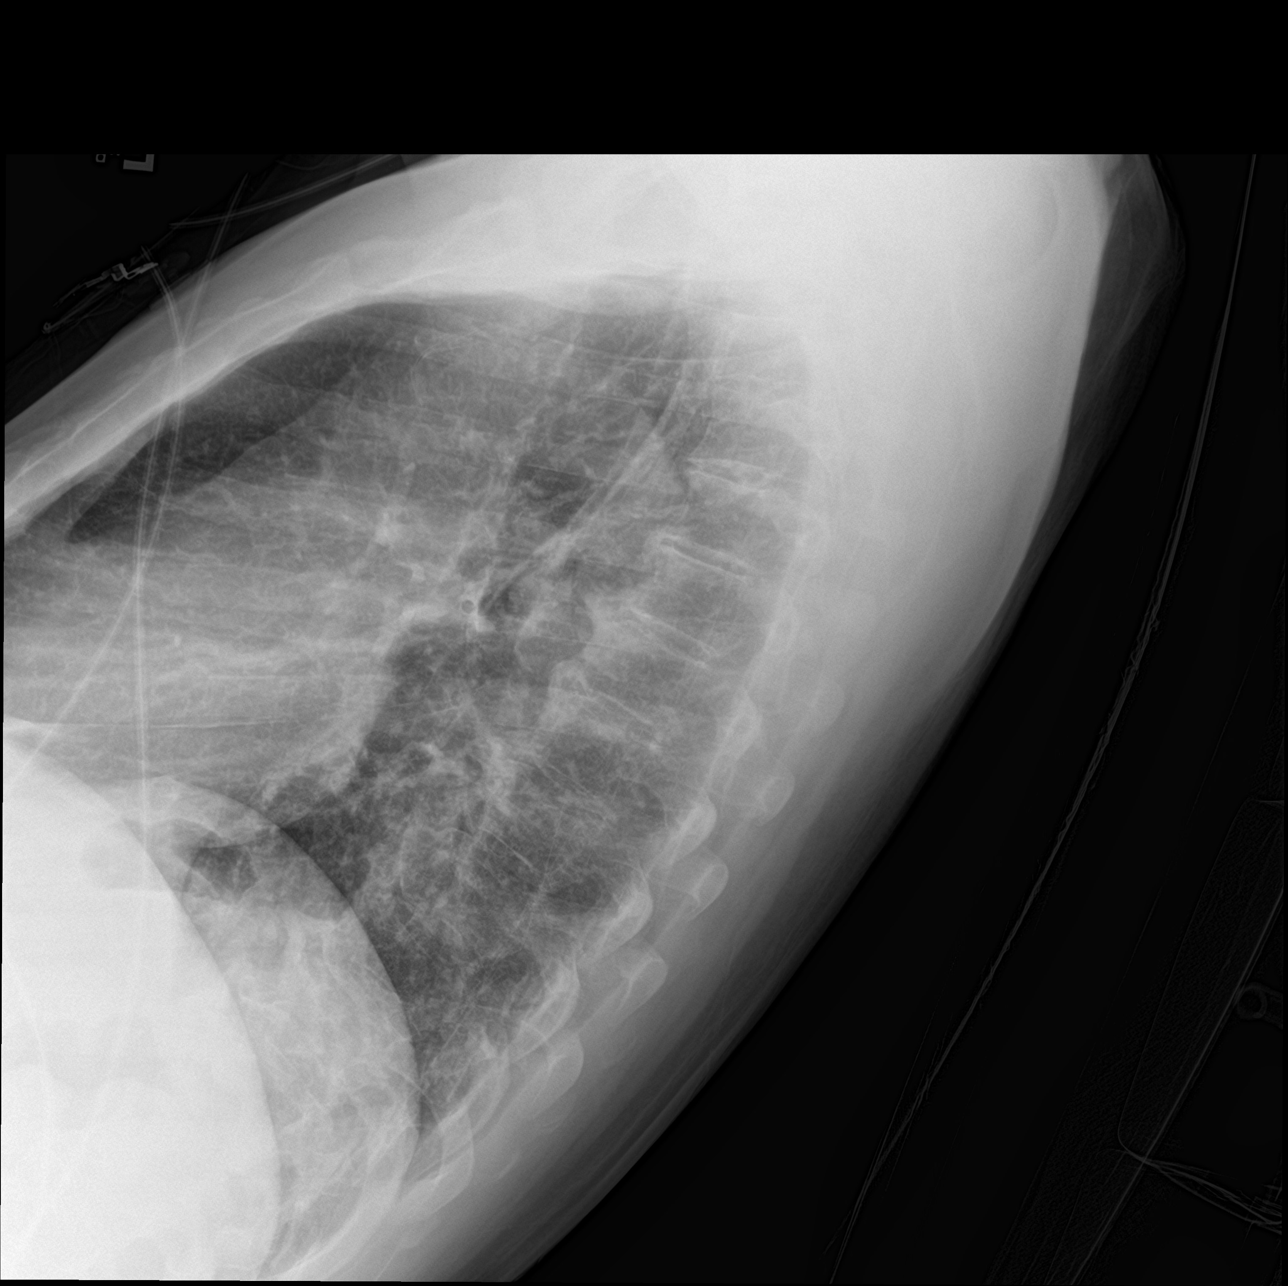

[chest ap]
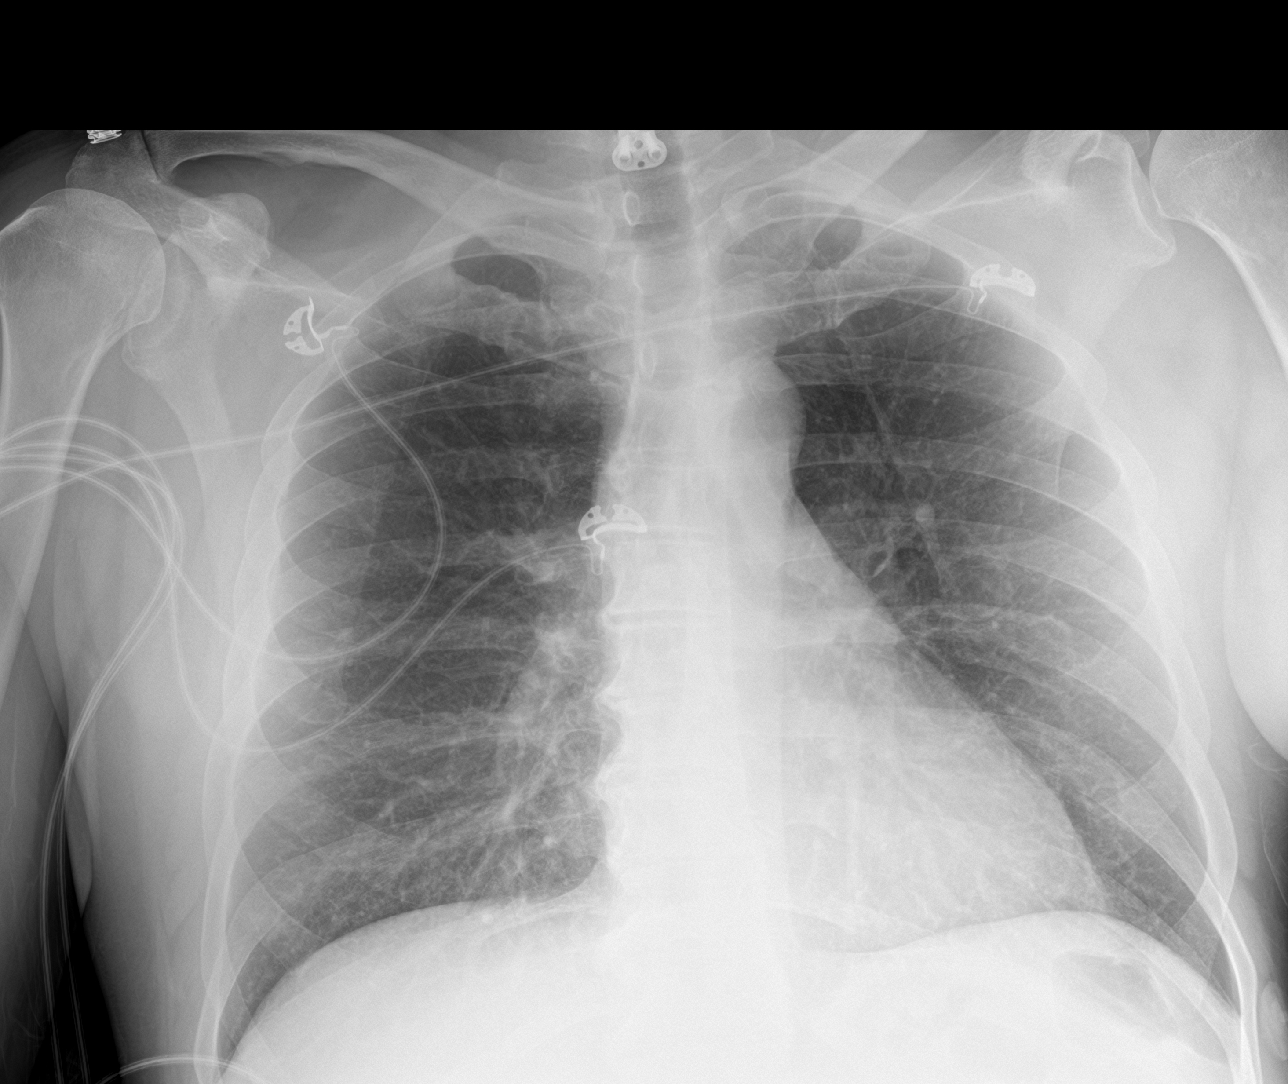

[chest lat (2 of 2)]
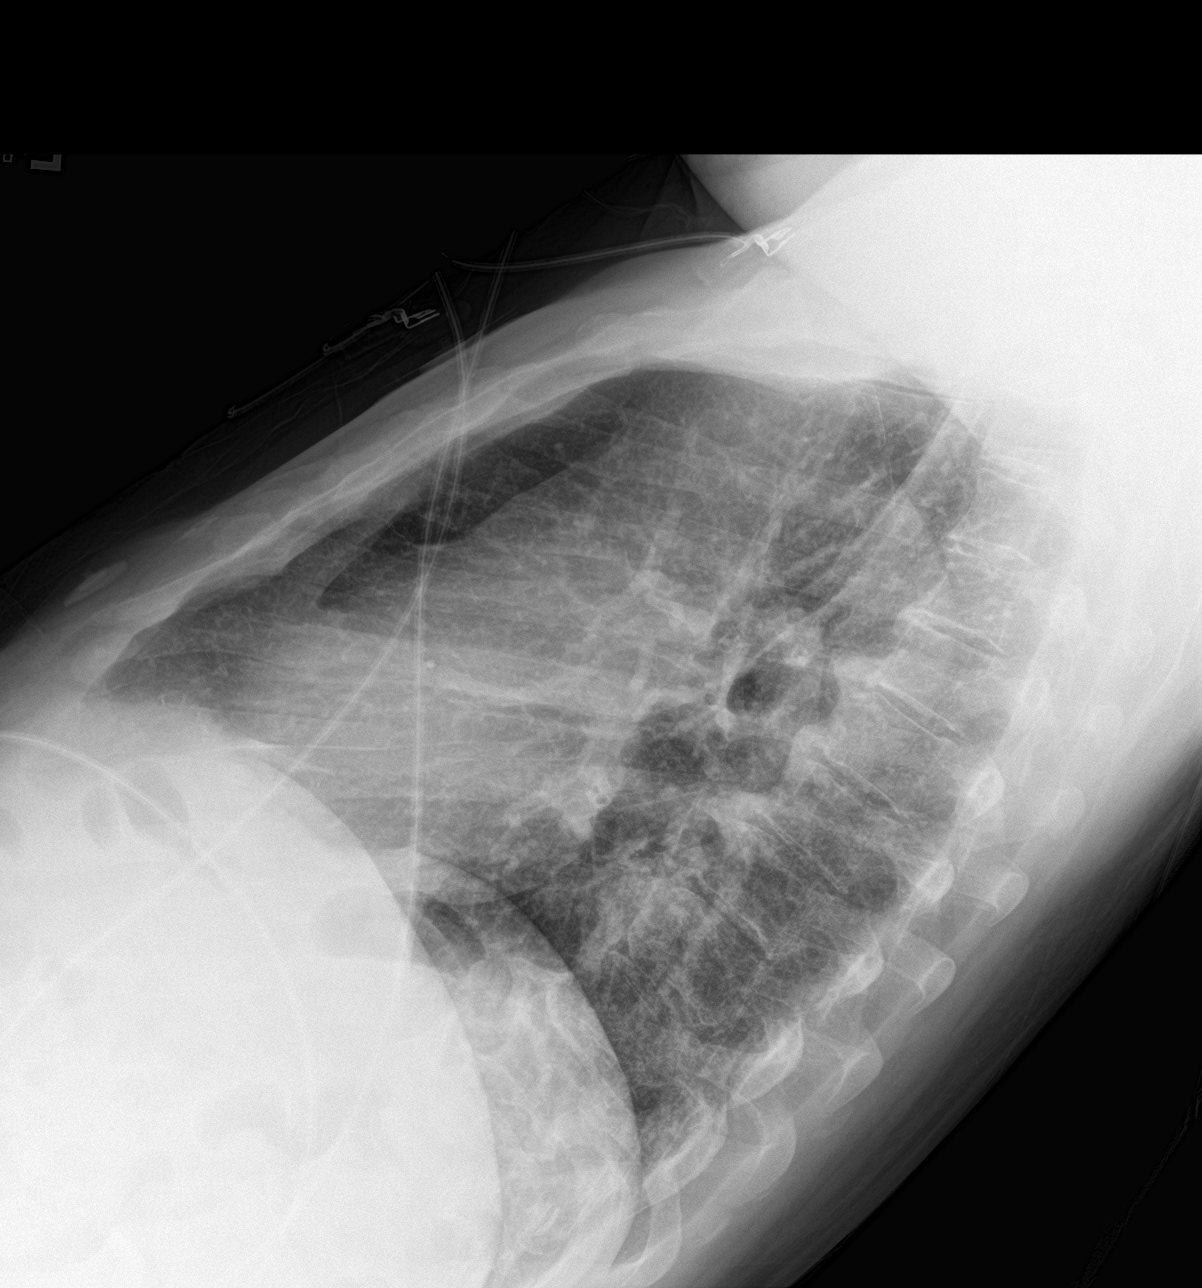

[3 of 3 positions shown; findings below may reference images not displayed]

FINDINGS: The heart size and mediastinal contours are within normal limits.
Both lungs are clear. The visualized skeletal structures show
postsurgical changes in the cervical spine.
IMPRESSION: No active cardiopulmonary disease.
